# Patient Record
Sex: Male | Born: 1990 | Race: White | Hispanic: No | Marital: Single | Smoking: Current every day smoker
Health system: Southern US, Community
[De-identification: ages and names within clinical notes are randomized; demographics above are authoritative.]

## PROBLEM LIST (undated history)

## (undated) DIAGNOSIS — L0390 Cellulitis, unspecified: Secondary | ICD-10-CM

## (undated) DIAGNOSIS — F419 Anxiety disorder, unspecified: Secondary | ICD-10-CM

## (undated) HISTORY — PX: HERNIA REPAIR: SHX51

## (undated) HISTORY — PX: ADENOIDECTOMY: SUR15

---
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
## 2010-12-15 NOTE — Op Note (Signed)
[place]. [facility]  Patient:    JAVARIE, CRISP                        MRN: [id number] Proc. Date: 12/11/99 Attending:  Veverly Fells. Arletha Grippe, M.D. CC:         Veverly Fells. Arletha Grippe, M.D.             Harrietta Guardian, M.D., [facility]                           Operative Report  PREOPERATIVE DIAGNOSIS:  Obstructive sleep apnea and tonsil and adenoid hypertrophy.  POSTOPERATIVE DIAGNOSIS:  Obstructive sleep apnea and tonsil and adenoid hypertrophy.  PROCEDURE PERFORMED:  Tonsillectomy and adenoidectomy.  SURGEON:  Veverly Fells. Arletha Grippe, M.D.  ANESTHESIA:  General endotracheal.  INDICATIONS:  Patient is an otherwise healthy 20-year-old white male, who gives a long history of nasal airway obstruction, loud breathing at night and breath holding episodes as documented by mothers history.  Physical examination does show extremely enlarged tonsils bilaterally with adenoid hypertrophy in the nasopharynx.  Based on his history and physical examination, I have recommended proceeding with the above noted surgical procedure.  I have discussed extensively with the family members the risks and benefits of surgery, including risk of general anesthesia, infection, bleeding and the normal recovery period after this type of surgery.  I have entertained any questions, answered them appropriately and informed consent has been obtained and patient presents for the above noted procedure.  OPERATIVE FINDINGS:  Enlarged tonsils bilaterally and large adenoidal tissue in the nasopharynx.  DETAILS OF PROCEDURE:  Patient was brought in the operating room, placed in the supine position, general endotracheal anesthesia was administered via the anesthesiologist without complication.  Patient was administered 500 mg of Ancef IV x one and 6 mg of Decadron IV x one.  Head of the table was turned 90 degrees.  Patients face was draped in a standard fashion.  A Crowe-Davis mouth retractor  was inserted into the oral cavity.  This was used to retract the mouth open.  A curved Allis clamp used to grasp the right tonsil and retract it medially.  The Harmonic scalpel was then used to dissect the tonsil free from the tonsillar fossa.  Bleeding was controlled with a combination of Harmonic scalpel and suction cautery without difficulty.  The tonsil was then removed and sent to surgical pathology for permanent section analysis.  Next, attention was turned to the left tonsil.  An identical procedure was carried out on this side compared to the right side with identical results.  After this was done, any bleeding from the tonsillar fossae were controlled meticulously with suction cautery without difficulty.  A red rubber catheter was placed through the right naris and brought out through oral cavity.  This was used retract the soft palate.  Indirect naris examination and nasopharynx showed enlarged adenoidal tissue obstructing the carina bilaterally.  The adenoids were removed with a few sweeps of the adenoid curet and any bleeding in the area was controlled with suction cautery without difficulty.  The nose and mouth were then irrigated with copious amounts of irrigation fluid and suctioned dry.  There was no evidence of any active bleeding at the end of this procedure.  Red rubber was brought out through the nasal chamber under suction without difficulty.  An oral gastric tube was placed.  This was used to decompress the  stomach contents.  It was then removed without incident and a total of 3 cc of 0.5% Marcaine solution with 1:200,000 epinephrine were infiltrated into the anterior tonsillar pillars and tongue base bilaterally. Crowe-Davis mouth retractor was released and brought out through oral cavity without incident.  Fluids given during procedure were approximately 200 cc of Crystalloid.  Estimated blood loss was less than 30 cc.  Urine output was not measured.  There were no  drains, no packs.  Specimens sent were tonsils x 2 and adenoids.  The patient tolerated the procedure well without complications, was extubated in the operating room and transferred to recovery room in stable condition.  Sponge, needle and instrument counts were correct at the end of the procedure.  Total duration of procedure was approximately 1 hour.  Patient will be admitted for overnight recovery and once he has recovered well, he will be sent home on Dec 12, 1999.  He will be sent home on amoxicillin elixir 250 mg p.o. t.i.d. for 10 days and Tylenol with codeine elixir 250 cc with two refills 5-10 cc p.o. q.4h. p.r.n. pain.  He is to have light activity and a post tonsillectomy diet for two weeks.  Patients family was given oral and written instructions.  They are to call for any problems of bleeding, fever, vomiting, pain or extra medications or any other questions.  He will follow up for postoperative check on Tuesday, May 29th at 3:50 p.m. DD:  12/11/99 TD:  12/11/99 Job: [id number]

## 2010-12-15 NOTE — Consult Note (Signed)
[place]. [facility]  Patient:    Devin Graham, Devin Graham Visit Number: [id number] MRN: [id number]          Service Type: EMS Location: [facility] Attending Physician:  Armanda Heritage Dictated by:   Cammy Copa, M.D. Proc. Date: 11/11/02 Admit Date:  11/10/2001 Discharge Date: 11/10/2001                            Consultation Report  CHIEF COMPLAINT:  Right wrist pain.  HISTORY OF PRESENT ILLNESS:  The patient is an 20 year old right-hand dominant patient who fell when shooting basketball today and landed on his right wrist. He reports right wrist pain.  PAST MEDICAL/SURGICAL HISTORY:  Unremarkable.  ALLERGIES:  No known drug allergies.  MEDICATIONS:  None.  PHYSICAL EXAMINATION:  GENERAL:  The patient is in mild distress.  EXTREMITIES:  He has some tenderness and very mild swelling around the right distal radius. Grip detail _____________ extended biceps, triceps, deltoids. Strength was ___/5. The elbow and shoulder range of motion is nontender and intact. Radial pulse 2+/4; sensation intact in a median, radial, and ulnar distribution. Plain x-rays show a dorsally angulated wrist fracture about 2 cm proximal to the _____ plane which is transverse and unicortical. There is about 20 degrees of dorsal ambulation.  IMPRESSION:  Dorsally angulated wrist fracture in an 46 year old child. Fracture is in the plane of his deformity.  PLAN:  Hematoma block was administered, and a general reduction was performed, and a cast was applied with 3-point molding. Post-reduction radiographs demonstrate mild improvement in the position of the fracture. I am going to see the patient back in 7 days for recheck. The patient was instructed to elevate his arm and to move his fingers as much as possible. Tylenol #3 elixir is given. Dictated by:   Cammy Copa, M.D. Attending Physician:  Armanda Heritage DD:  11/10/01 TD:  11/11/01 Job: [id number]

## 2014-01-14 ENCOUNTER — Encounter (HOSPITAL_COMMUNITY): Payer: Self-pay | Admitting: Emergency Medicine

## 2014-01-14 ENCOUNTER — Emergency Department (HOSPITAL_COMMUNITY)
Admission: EM | Admit: 2014-01-14 | Discharge: 2014-01-14 | Disposition: A | Discharge status: Home / Self Care | Payer: Self-pay | Attending: Emergency Medicine | Admitting: Emergency Medicine

## 2014-01-14 DIAGNOSIS — H938X9 Other specified disorders of ear, unspecified ear: Secondary | ICD-10-CM | POA: Insufficient documentation

## 2014-01-14 DIAGNOSIS — H938X2 Other specified disorders of left ear: Secondary | ICD-10-CM

## 2014-01-14 DIAGNOSIS — R599 Enlarged lymph nodes, unspecified: Secondary | ICD-10-CM | POA: Insufficient documentation

## 2014-01-14 DIAGNOSIS — Z87891 Personal history of nicotine dependence: Secondary | ICD-10-CM | POA: Insufficient documentation

## 2014-01-14 DIAGNOSIS — Q170 Accessory auricle: Secondary | ICD-10-CM | POA: Insufficient documentation

## 2014-01-14 DIAGNOSIS — R590 Localized enlarged lymph nodes: Secondary | ICD-10-CM

## 2014-01-14 NOTE — ED Notes (Signed)
Patient refused wheelchair.  Walked patient out. 

## 2014-01-14 NOTE — ED Notes (Signed)
Patient states he started getting possible abscesses in L earlobe approximately 1 year ago.  Patient states that it comes and goes.   Patient states he has "been able to pop previously, but I didn't get a lot of stuff out of it".

## 2014-01-14 NOTE — ED Provider Notes (Signed)
CSN: [id number]     Arrival date & time 01/14/14  0820 History   First MD Initiated Contact with Patient 01/14/14 [phone]     Chief Complaint  Patient presents with  . mass in ear lobe      (Consider location/radiation/quality/duration/timing/severity/associated sxs/prior Treatment) HPI Pt is a 23yo male presenting to ED with a mass on his left earlobe.  Pt states he started to get possible abscesses on his left earlobe about 1 year ago.  States the mass comes and goes. He has "been able to pop previously, but didn't get a lot of stuff out of it." Pt states the mass is not usually painful but has been increasingly more irritated with head rotation and when he tries to "pop" it. Denies fever, n/v/d. Denies cough, congestion, sore throat, middle ear pain, hx of cancer, or unexplained weight loss.   History reviewed. No pertinent past medical history. Past Surgical History  Procedure Laterality Date  . Hernia repair    . Adenoidectomy     No family history on file. History  Substance Use Topics  . Smoking status: Former Games developermoker  . Smokeless tobacco: Not on file  . Alcohol Use: Yes    Review of Systems  Constitutional: Negative for fever and chills.  Gastrointestinal: Negative for nausea and vomiting.  Skin: Negative for color change and wound.  All other systems reviewed and are negative.     Allergies  Review of patient's allergies indicates no known allergies.  Home Medications   Prior to Admission medications   Not on File   BP 125/82  Pulse 74  Temp(Src) 97.7 F (36.5 C) (Oral)  Resp 18  Ht 5\' 11"  (1.803 m)  Wt 190 lb (86.183 kg)  BMI 26.51 kg/m2  SpO2 100% Physical Exam  Nursing note and vitals reviewed. Constitutional: He is oriented to person, place, and time. He appears well-developed and well-nourished.  HENT:  Head: Normocephalic and atraumatic.  Eyes: EOM are normal.  Neck: Normal range of motion.  Cardiovascular: Normal rate.   Pulmonary/Chest: Effort  normal.  Musculoskeletal: Normal range of motion.  Neurological: He is alert and oriented to person, place, and time.  Skin: Skin is warm and dry.  Pea-sized hard mobile mass with mild tenderness in area of preauricular lymph node. No erythema or ecchymosis. No discharge or red streaking.   Psychiatric: He has a normal mood and affect. His behavior is normal.    ED Course  Procedures (including critical care time) Labs Review Labs Reviewed - No data to display  Imaging Review No results found.   EKG Interpretation None      MDM   Final diagnoses:  Mass of left ear  Lymphadenopathy, preauricular    Pt presenting to ED with c/o tender mass to left earlobe x1 year.  On exam, mass is in area of preauricular lymph node.  Does not appear cellulitic or an abscess. Lymphadenopathy vs lipoma. Advised pt to only apply cool compresses and take acetaminophen and/or ibuprofen for pain. Discouraged pt from attempting to "pop" the mass at his may cause it to enlarge even more. [facility], and general surgery so it can be monitored and possibly removed if it continues to become enlarged and cause pt irritation.      Junius FinnerErin O'Malley, PA-C 01/14/14 [phone]

## 2014-01-14 NOTE — ED Provider Notes (Signed)
Medical screening examination/treatment/procedure(s) were performed by non-physician practitioner and as supervising physician I was immediately available for consultation/collaboration.   EKG Interpretation None        Kristen N Ward, DO 01/14/14 1243 

## 2014-01-14 NOTE — Discharge Instructions (Signed)
You may try cool compresses on left ear and take acetaminophen and ibuprofen as needed for pain. See below for more instruction.   Swollen Lymph Nodes The lymphatic system filters fluid from around cells. It is like a system of blood vessels. These channels carry lymph instead of blood. The lymphatic system is an important part of the immune (disease fighting) system. When people talk about "swollen glands in the neck," they are usually talking about swollen lymph nodes. The lymph nodes are like the little traps for infection. You and your caregiver may be able to feel lymph nodes, especially swollen nodes, in these common areas: the groin (inguinal area), armpits (axilla), and above the clavicle (supraclavicular). You may also feel them in the neck (cervical) and the back of the head just above the hairline (occipital). Swollen glands occur when there is any condition in which the body responds with an allergic type of reaction. For instance, the glands in the neck can become swollen from insect bites or any type of minor infection on the head. These are very noticeable in children with only minor problems. Lymph nodes may also become swollen when there is a tumor or problem with the lymphatic system, such as Hodgkin's disease. TREATMENT   Most swollen glands do not require treatment. They can be observed (watched) for a short period of time, if your caregiver feels it is necessary. Most of the time, observation is not necessary.  Antibiotics (medicines that kill germs) may be prescribed by your caregiver. Your caregiver may prescribe these if he or she feels the swollen glands are due to a bacterial (germ) infection. Antibiotics are not used if the swollen glands are caused by a virus. HOME CARE INSTRUCTIONS   Take medications as directed by your caregiver. Only take over-the-counter or prescription medicines for pain, discomfort, or fever as directed by your caregiver. SEEK MEDICAL CARE IF:   If  you begin to run a temperature greater than 102 F (38.9 C), or as your caregiver suggests. MAKE SURE YOU:   Understand these instructions.  Will watch your condition.  Will get help right away if you are not doing well or get worse. Document Released: 07/06/2002 Document Revised: 10/08/2011 Document Reviewed: 07/16/2005 [facility] Patient Information 2015 [place], [place]. This information is not intended to replace advice given to you by your health care provider. Make sure you discuss any questions you have with your health care provider.

## 2014-01-21 ENCOUNTER — Encounter (HOSPITAL_COMMUNITY): Payer: Self-pay | Admitting: Emergency Medicine

## 2014-01-21 ENCOUNTER — Emergency Department (HOSPITAL_COMMUNITY)
Admission: EM | Admit: 2014-01-21 | Discharge: 2014-01-22 | Disposition: A | Discharge status: Home / Self Care | Payer: Self-pay | Attending: Emergency Medicine | Admitting: Emergency Medicine

## 2014-01-21 DIAGNOSIS — G43009 Migraine without aura, not intractable, without status migrainosus: Secondary | ICD-10-CM

## 2014-01-21 DIAGNOSIS — G43909 Migraine, unspecified, not intractable, without status migrainosus: Secondary | ICD-10-CM | POA: Insufficient documentation

## 2014-01-21 DIAGNOSIS — R0789 Other chest pain: Secondary | ICD-10-CM | POA: Insufficient documentation

## 2014-01-21 DIAGNOSIS — L723 Sebaceous cyst: Secondary | ICD-10-CM | POA: Insufficient documentation

## 2014-01-21 DIAGNOSIS — Z792 Long term (current) use of antibiotics: Secondary | ICD-10-CM | POA: Insufficient documentation

## 2014-01-21 DIAGNOSIS — Z87891 Personal history of nicotine dependence: Secondary | ICD-10-CM | POA: Insufficient documentation

## 2014-01-21 LAB — CBC
HEMATOCRIT: 43.4 % (ref 39.0–52.0)
HEMOGLOBIN: 15.4 g/dL (ref 13.0–17.0)
MCH: 32.1 pg (ref 26.0–34.0)
MCHC: 35.5 g/dL (ref 30.0–36.0)
MCV: 90.4 fL (ref 78.0–100.0)
Platelets: 196 10*3/uL (ref 150–400)
RBC: 4.8 MIL/uL (ref 4.22–5.81)
RDW: 12 % (ref 11.5–15.5)
WBC: 7.7 10*3/uL (ref 4.0–10.5)

## 2014-01-21 LAB — I-STAT TROPONIN, ED: Troponin i, poc: 0 ng/mL (ref 0.00–0.08)

## 2014-01-21 LAB — BASIC METABOLIC PANEL
BUN: 10 mg/dL (ref 6–23)
CHLORIDE: 101 meq/L (ref 96–112)
CO2: 28 mEq/L (ref 19–32)
Calcium: 10 mg/dL (ref 8.4–10.5)
Creatinine, Ser: 0.93 mg/dL (ref 0.50–1.35)
GFR calc Af Amer: >90 mL/min (ref >90)
GFR calc non Af Amer: >90 mL/min (ref >90)
Glucose, Bld: 95 mg/dL (ref 70–99)
Potassium: 4.9 mEq/L (ref 3.7–5.3)
SODIUM: 142 meq/L (ref 137–147)

## 2014-01-21 LAB — LIPASE, BLOOD: LIPASE: 28 U/L (ref 11–59)

## 2014-01-21 NOTE — ED Provider Notes (Signed)
MSE was initiated and I personally evaluated the patient and placed orders (if any) at  10:58 PM on January 21, 2014.  Devin Graham is a 23 y.o. male who presents to the Emergency Department complaining of large abscess to back of L ear with associated migraine that presented 1 month ago. States the abscess initially became swollen then noticed the infection. He was seen last week, 01/14/2014, and informed he had "an inflamed lymph node"; states he put a needle in the abscess, 01/16/2014, and had blood and puss drainage. Was advised to come to the ED by PCP. Pt rates his migraine 6/10 and reports feeling nauseated and abd pain. He reports having low grade fevers, less than 100 for the past few days and chest tightness that began today; states the chest tightness was sharp and sudden. He states the pain began while resting after work. He also c/o neck pain and neck stiffness that began "a few days ago". Smoke 1ppd of cigarettes. Denies CP, SOB, diarrhea, emesis, hematuria, hematochezia, tarry stools, internal ear pain, trouble hearing, chills or piercing to L ear.  I personally performed the services described in this documentation, which was scribed in my presence. The recorded information has been reviewed and is accurate.  Patient presenting to the ED with multiple complaints-neck pain, neck stiffness, low-grade fever, chest tightness, nausea and lower, pain that started approximately 2 days ago. Patient reported that he was seen and assessed by his primary care provider who recommended patient to come to the emergency department to be further evaluated. Patient was laid seen and assessed in ED setting on 01/14/2014 regarding his ongoing enlarged lymph node he was discharged home with no medications and close followup. Do to multiple complaints and beginnings of chest and discomfort patient to be moved to main ED for further evaluation. Labs and imaging ordered.  The patient appears stable so that the  remainder of the MSE may be completed by another provider.  Devin MuttonMarissa Rileyann Florance, PA-C 01/22/14 [phone]

## 2014-01-21 NOTE — ED Notes (Signed)
Patient presents with a large "abcess" to the back of left ear.  Was seen for the same last week and was told it was a swollen lymphnode.  Stuck a needle in the area and got some "pus" out.

## 2014-01-21 NOTE — ED Notes (Signed)
Per request of M.Sciacca PA-C acuity changed to 3. Pt reported to PA-C he has had stiff neck ,CP .

## 2014-01-22 ENCOUNTER — Emergency Department (HOSPITAL_COMMUNITY): Payer: Self-pay

## 2014-01-22 LAB — URINALYSIS, ROUTINE W REFLEX MICROSCOPIC
Glucose, UA: NEGATIVE mg/dL
Hgb urine dipstick: NEGATIVE
KETONES UR: 15 mg/dL — AB
LEUKOCYTES UA: NEGATIVE
Nitrite: NEGATIVE
PH: 6 (ref 5.0–8.0)
Protein, ur: NEGATIVE mg/dL
Specific Gravity, Urine: 1.037 — ABNORMAL HIGH (ref 1.005–1.030)
Urobilinogen, UA: 1 mg/dL (ref 0.0–1.0)

## 2014-01-22 MED ORDER — BUTALBITAL-APAP-CAFFEINE 50-325-40 MG PO TABS: 1.0000 | ORAL_TABLET | Freq: Four times a day (QID) | ORAL | Status: DC | PRN

## 2014-01-22 MED ORDER — BUTALBITAL-APAP-CAFFEINE 50-325-40 MG PO TABS: 1.0000 | ORAL_TABLET | Freq: Four times a day (QID) | ORAL | Status: AC | PRN

## 2014-01-22 NOTE — ED Notes (Signed)
Patient transported to X-ray 

## 2014-01-22 NOTE — Discharge Instructions (Signed)
Chest Pain (Nonspecific) It is often hard to give a diagnosis for the cause of chest pain. There is always a chance that your pain could be related to something serious, such as a heart attack or a blood clot in the lungs. You need to follow up with your doctor. HOME CARE  If antibiotic medicine was given, take it as directed by your doctor. Finish the medicine even if you start to feel better.  For the next few days, avoid activities that bring on chest pain. Continue physical activities as told by your doctor.  Do not use any tobacco products. This includes cigarettes, chewing tobacco, and e-cigarettes.  Avoid drinking alcohol.  Only take medicine as told by your doctor.  Follow your doctor's suggestions for more testing if your chest pain does not go away.  Keep all doctor visits you made. GET HELP IF:  Your chest pain does not go away, even after treatment.  You have a rash with blisters on your chest.  You have a fever. GET HELP RIGHT AWAY IF:   You have more pain or pain that spreads to your arm, neck, jaw, back, or belly (abdomen).  You have shortness of breath.  You cough more than usual or cough up blood.  You have very bad back or belly pain.  You feel sick to your stomach (nauseous) or throw up (vomit).  You have very bad weakness.  You pass out (faint).  You have chills. This is an emergency. Do not wait to see if the problems will go away. Call your local emergency services (911 in [place] Do not drive yourself to the hospital. MAKE SURE YOU:   Understand these instructions.  Will watch your condition.  Will get help right away if you are not doing well or get worse. Document Released: 01/02/2008 Document Revised: 07/21/2013 Document Reviewed: 01/02/2008 [facility] Patient Information 2015 [place]ExitCare, [place]. This information is not intended to replace advice given to you by your health care provider. Make sure you discuss any questions you have with your  health care provider.  Epidermal Cyst An epidermal cyst is sometimes called a sebaceous cyst, epidermal inclusion cyst, or infundibular cyst. These cysts usually contain a substance that looks "pasty" or "cheesy" and may have a bad smell. This substance is a protein called keratin. Epidermal cysts are usually found on the face, neck, or trunk. They may also occur in the vaginal area or other parts of the genitalia of both men and women. Epidermal cysts are usually small, painless, slow-growing bumps or lumps that move freely under the skin. It is important not to try to pop them. This may cause an infection and lead to tenderness and swelling. CAUSES  Epidermal cysts may be caused by a deep penetrating injury to the skin or a plugged hair follicle, often associated with acne. SYMPTOMS  Epidermal cysts can become inflamed and cause:  Redness.  Tenderness.  Increased temperature of the skin over the bumps or lumps.  Grayish-white, bad smelling material that drains from the bump or lump. DIAGNOSIS  Epidermal cysts are easily diagnosed by your caregiver during an exam. Rarely, a tissue sample (biopsy) may be taken to rule out other conditions that may resemble epidermal cysts. TREATMENT   Epidermal cysts often get better and disappear on their own. They are rarely ever cancerous.  If a cyst becomes infected, it may become inflamed and tender. This may require opening and draining the cyst. Treatment with antibiotics may be necessary. When the  infection is gone, the cyst may be removed with minor surgery.  Small, inflamed cysts can often be treated with antibiotics or by injecting steroid medicines.  Sometimes, epidermal cysts become large and bothersome. If this happens, surgical removal in your caregiver's office may be necessary. HOME CARE INSTRUCTIONS  Only take over-the-counter or prescription medicines as directed by your caregiver.  Take your antibiotics as directed. Finish them even  if you start to feel better. SEEK MEDICAL CARE IF:   Your cyst becomes tender, red, or swollen.  Your condition is not improving or is getting worse.  You have any other questions or concerns. MAKE SURE YOU:  Understand these instructions.  Will watch your condition.  Will get help right away if you are not doing well or get worse. Document Released: 06/16/2004 Document Revised: 10/08/2011 Document Reviewed: 01/22/2011 [facility] Patient Information 2015 [place]ExitCare, [place]LL[place]. This information is not intended to replace advice given to you by your health care provider. Make sure you discuss any questions you have with your health care provider.  Migraine Headache A migraine headache is an intense, throbbing pain on one or both sides of your head. A migraine can last for 30 minutes to several hours. CAUSES  The exact cause of a migraine headache is not always known. However, a migraine may be caused when nerves in the brain become irritated and release chemicals that cause inflammation. This causes pain. Certain things may also trigger migraines, such as:  Alcohol.  Smoking.  Stress.  Menstruation.  Aged cheeses.  Foods or drinks that contain nitrates, glutamate, aspartame, or tyramine.  Lack of sleep.  Chocolate.  Caffeine.  Hunger.  Physical exertion.  Fatigue.  Medicines used to treat chest pain (nitroglycerine), birth control pills, estrogen, and some blood pressure medicines. SIGNS AND SYMPTOMS  Pain on one or both sides of your head.  Pulsating or throbbing pain.  Severe pain that prevents daily activities.  Pain that is aggravated by any physical activity.  Nausea, vomiting, or both.  Dizziness.  Pain with exposure to bright lights, loud noises, or activity.  General sensitivity to bright lights, loud noises, or smells. Before you get a migraine, you may get warning signs that a migraine is coming (aura). An aura may include:  Seeing flashing  lights.  Seeing bright spots, halos, or zig-zag lines.  Having tunnel vision or blurred vision.  Having feelings of numbness or tingling.  Having trouble talking.  Having muscle weakness. DIAGNOSIS  A migraine headache is often diagnosed based on:  Symptoms.  Physical exam.  A CT scan or MRI of your head. These imaging tests cannot diagnose migraines, but they can help rule out other causes of headaches. TREATMENT Medicines may be given for pain and nausea. Medicines can also be given to help prevent recurrent migraines.  HOME CARE INSTRUCTIONS  Only take over-the-counter or prescription medicines for pain or discomfort as directed by your health care provider. The use of long-term narcotics is not recommended.  Lie down in a dark, quiet room when you have a migraine.  Keep a journal to find out what may trigger your migraine headaches. For example, write down:  What you eat and drink.  How much sleep you get.  Any change to your diet or medicines.  Limit alcohol consumption.  Quit smoking if you smoke.  Get 7-9 hours of sleep, or as recommended by your health care provider.  Limit stress.  Keep lights dim if bright lights bother you and make your  migraines worse. SEEK IMMEDIATE MEDICAL CARE IF:   Your migraine becomes severe.  You have a fever.  You have a stiff neck.  You have vision loss.  You have muscular weakness or loss of muscle control.  You start losing your balance or have trouble walking.  You feel faint or pass out.  You have severe symptoms that are different from your first symptoms. MAKE SURE YOU:   Understand these instructions.  Will watch your condition.  Will get help right away if you are not doing well or get worse. Document Released: 07/16/2005 Document Revised: 05/06/2013 Document Reviewed: 03/23/2013 [facility] Patient Information 2015 [place], [place]. This information is not intended to replace advice given to you by your  health care provider. Make sure you discuss any questions you have with your health care provider.

## 2014-01-22 NOTE — ED Notes (Addendum)
Pt reports bump on back of left ear lobe for 2 years. States bump got larger in past month. Came to the ER on 01/14/14 and was told it was a swollen lymph node. Pt states he aspirated bump with a needle by himself on 01/16/14 and pus drained from site. Pt has an abscess on back of left ear lobe approximately a size of a dime. Pt denies chest pain and abdominal pain at this time.

## 2014-01-22 NOTE — ED Provider Notes (Signed)
Medical screening examination/treatment/procedure(s) were performed by non-physician practitioner and as supervising physician I was immediately available for consultation/collaboration.   EKG Interpretation   Date/Time:  Friday January 22 2014 00:11:34 EDT Ventricular Rate:  58 PR Interval:  172 QRS Duration: 90 QT Interval:  398 QTC Calculation: 390 R Axis:   44 Text Interpretation:  Sinus bradycardia Cannot rule out Anterior infarct ,  age undetermined Abnormal ECG No old tracing to compare Confirmed by OTTER   MD, OLGA ([id number]) on 01/22/2014 3:04:39 AM        Gwyneth SproutWhitney Plunkett, MD 01/22/14 1408

## 2014-01-22 NOTE — ED Notes (Signed)
MD at bedside. 

## 2014-01-24 LAB — WOUND CULTURE

## 2014-01-25 ENCOUNTER — Emergency Department (HOSPITAL_COMMUNITY)
Admission: EM | Admit: 2014-01-25 | Discharge: 2014-01-25 | Disposition: A | Discharge status: Home / Self Care | Payer: Self-pay | Attending: Emergency Medicine | Admitting: Emergency Medicine

## 2014-01-25 ENCOUNTER — Emergency Department (HOSPITAL_COMMUNITY): Payer: Self-pay

## 2014-01-25 ENCOUNTER — Encounter (HOSPITAL_COMMUNITY): Payer: Self-pay | Admitting: Emergency Medicine

## 2014-01-25 ENCOUNTER — Other Ambulatory Visit: Payer: Self-pay

## 2014-01-25 DIAGNOSIS — R51 Headache: Secondary | ICD-10-CM | POA: Insufficient documentation

## 2014-01-25 DIAGNOSIS — R42 Dizziness and giddiness: Secondary | ICD-10-CM | POA: Insufficient documentation

## 2014-01-25 DIAGNOSIS — R0789 Other chest pain: Secondary | ICD-10-CM | POA: Insufficient documentation

## 2014-01-25 DIAGNOSIS — Z87891 Personal history of nicotine dependence: Secondary | ICD-10-CM | POA: Insufficient documentation

## 2014-01-25 DIAGNOSIS — R519 Headache, unspecified: Secondary | ICD-10-CM

## 2014-01-25 DIAGNOSIS — R209 Unspecified disturbances of skin sensation: Secondary | ICD-10-CM | POA: Insufficient documentation

## 2014-01-25 DIAGNOSIS — R112 Nausea with vomiting, unspecified: Secondary | ICD-10-CM | POA: Insufficient documentation

## 2014-01-25 LAB — I-STAT CHEM 8, ED
BUN: 10 mg/dL (ref 6–23)
CREATININE: 1 mg/dL (ref 0.50–1.35)
Calcium, Ion: 1.22 mmol/L (ref 1.12–1.23)
Chloride: 100 mEq/L (ref 96–112)
Glucose, Bld: 91 mg/dL (ref 70–99)
HCT: 47 % (ref 39.0–52.0)
Hemoglobin: 16 g/dL (ref 13.0–17.0)
Potassium: 4.3 mEq/L (ref 3.7–5.3)
Sodium: 142 mEq/L (ref 137–147)
TCO2: 26 mmol/L (ref 0–100)

## 2014-01-25 LAB — I-STAT TROPONIN, ED: Troponin i, poc: 0 ng/mL (ref 0.00–0.08)

## 2014-01-25 MED ORDER — ONDANSETRON 4 MG PO TBDP: ORAL_TABLET | ORAL | Status: DC

## 2014-01-25 MED ORDER — DOXYCYCLINE HYCLATE 100 MG PO CAPS: 100.0000 mg | ORAL_CAPSULE | Freq: Two times a day (BID) | ORAL | Status: DC

## 2014-01-25 MED ORDER — SODIUM CHLORIDE 0.9 % IV BOLUS (SEPSIS)
1000.0000 mL | INTRAVENOUS | Status: AC
Start: 2014-01-25 — End: 2014-01-25
  Administered 2014-01-25: 1000 mL via INTRAVENOUS

## 2014-01-25 MED ORDER — ACETAMINOPHEN 325 MG PO TABS
650.0000 mg | ORAL_TABLET | Freq: Once | ORAL | Status: AC
  Administered 2014-01-25: 650 mg via ORAL
  Filled 2014-01-25: qty 2

## 2014-01-25 NOTE — ED Notes (Signed)
The main system is down to obtain EKG's and have to use the old machine to obtain EKG but patient is in CT.

## 2014-01-25 NOTE — ED Provider Notes (Signed)
CSN: [id number]     Arrival date & time 01/25/14  1121 History   First MD Initiated Contact with Patient 01/25/14 1459     Chief Complaint  Patient presents with  . Headache  . Emesis     (Consider location/radiation/quality/duration/timing/severity/associated sxs/prior Treatment) Patient is a 23 y.o. male presenting with headaches and vomiting. The history is provided by the patient.  Headache Pain location:  L temporal Radiates to:  Does not radiate Severity currently:  2/10 Severity at highest:  2/10 Onset quality:  Gradual Duration:  5 hours Timing:  Constant Progression:  Partially resolved Chronicity:  New Similar to prior headaches: yes   Context comment:  After emesis Relieved by:  Nothing Worsened by:  Nothing tried Ineffective treatments:  None tried Associated symptoms: nausea and vomiting   Associated symptoms: no abdominal pain, no cough, no diarrhea, no pain, no fever, no neck pain and no numbness   Emesis Associated symptoms: headaches   Associated symptoms: no abdominal pain and no diarrhea     History reviewed. No pertinent past medical history. Past Surgical History  Procedure Laterality Date  . Hernia repair    . Adenoidectomy     History reviewed. No pertinent family history. History  Substance Use Topics  . Smoking status: Former Games developer  . Smokeless tobacco: Not on file  . Alcohol Use: Yes    Review of Systems  Constitutional: Negative for fever.  HENT: Negative for drooling and rhinorrhea.   Eyes: Negative for pain.  Respiratory: Positive for chest tightness. Negative for cough and shortness of breath.   Cardiovascular: Negative for chest pain and leg swelling.  Gastrointestinal: Positive for nausea and vomiting. Negative for abdominal pain and diarrhea.  Genitourinary: Negative for dysuria and hematuria.  Musculoskeletal: Negative for gait problem and neck pain.  Skin: Negative for color change.  Neurological: Positive for headaches.  Negative for numbness.       Paresthesias  Hematological: Negative for adenopathy.  Psychiatric/Behavioral: Negative for behavioral problems.  All other systems reviewed and are negative.     Allergies  Review of patient's allergies indicates no known allergies.  Home Medications   Prior to Admission medications   Medication Sig Start Date End Date Taking? Authorizing Provider  butalbital-acetaminophen-caffeine (FIORICET) 50-325-40 MG per tablet Take 1 tablet by mouth every 6 (six) hours as needed for headache. 01/22/14 01/22/15  Olivia Mackie, MD   BP 129/77  Pulse 64  Temp(Src) 97.9 F (36.6 C) (Oral)  Resp 18  SpO2 97% Physical Exam  Nursing note and vitals reviewed. Constitutional: He is oriented to person, place, and time. He appears well-developed and well-nourished.  HENT:  Head: Normocephalic and atraumatic.  Right Ear: External ear normal.  Left Ear: External ear normal.  Nose: Nose normal.  Mouth/Throat: Oropharynx is clear and moist. No oropharyngeal exudate.  Small sebaceous cyst behind the left earlobe which is soft. Is not indurated or erythematous.   Normal appearing tympanic membranes bilaterally.  Eyes: Conjunctivae and EOM are normal. Pupils are equal, round, and reactive to light.  Neck: Normal range of motion. Neck supple.  Cardiovascular: Normal rate, regular rhythm, normal heart sounds and intact distal pulses.  Exam reveals no gallop and no friction rub.   No murmur heard. Pulmonary/Chest: Effort normal and breath sounds normal. No respiratory distress. He has no wheezes.  Abdominal: Soft. Bowel sounds are normal. He exhibits no distension. There is no tenderness. There is no rebound and no guarding.  Musculoskeletal: Normal range  of motion. He exhibits no edema and no tenderness.  Neurological: He is alert and oriented to person, place, and time.  alert, oriented x3 speech: normal in context and clarity memory: intact grossly cranial nerves II-XII:  intact motor strength: full proximally and distally no involuntary movements or tremors sensation: intact to light touch diffusely  cerebellar: finger-to-nose and heel-to-shin intact gait: normal forwards and backwards, normal tandem gait   Skin: Skin is warm and dry.  Psychiatric: He has a normal mood and affect. His behavior is normal.    ED Course  Procedures (including critical care time) Labs Review Labs Reviewed  I-STAT CHEM 8, ED  Rosezena SensorI-STAT TROPOININ, ED    Imaging Review Ct Head Wo Contrast  01/25/2014   CLINICAL DATA:  Headache.  Nausea and vomiting.  Dizziness.  EXAM: CT HEAD WITHOUT CONTRAST  TECHNIQUE: Contiguous axial images were obtained from the base of the skull through the vertex without intravenous contrast.  COMPARISON:  None.  FINDINGS: Ventricular system normal in size and appearance for age. No mass lesion. No midline shift. No acute hemorrhage or hematoma. No extra-axial fluid collections. No evidence of acute infarction. No focal brain parenchymal abnormalities.  No focal osseous abnormalities involving the skull. Visualized paranasal sinuses, bilateral mastoid air cells, and bilateral middle ear cavities well-aerated.  IMPRESSION: Normal examination.   Electronically Signed   By: Hulan Saashomas  Lawrence M.D.   On: 01/25/2014 15:43     EKG Interpretation None      Date: 01/25/2014  Rate: 56  Rhythm: sinus bradycardia  QRS Axis: normal  Intervals: normal  ST/T Wave abnormalities: normal  Conduction Disutrbances:none  Narrative Interpretation: No ST or T wave changes consistent with ischemia.    Old EKG Reviewed: unchanged   MDM   Final diagnoses:  Headache, unspecified headache type    3:18 PM 23 y.o. male who presents with an episode of vomiting and paresthesias which began approximately 10:30 AM this morning. He notes that he ate a biscuit and approximately 15 minutes later became nauseous and had one episode of emesis. After this he developed a gradual  onset 2/10 left temporal headache and some lightheadedness. He also had some paresthesias of his face, hands, and left foot which resolved after 30 minutes. He currently has a 2/10 headache but denies any other symptoms. He does note that he had some mild chest tightness but this has resolved as well. He was seen recently for appears to be a sebaceous cyst behind his left earlobe which was aspirated. He does not appear to be infected or indurated. His vital signs are unremarkable and has a normal neurologic exam. Will get screening labs and imaging.  4:49 PM: I interpreted/reviewed the labs and/or imaging which were non-contributory.  Likely viral syndrome vs food ingestion as cause of his vomiting. HA now almost gone. Pt continues to appear well. He notes tick exposure several weeks ago, will tx empirically w/ doxy.  I have discussed the diagnosis/risks/treatment options with the patient and family and believe the pt to be eligible for discharge home to follow-up with pcp as needed. We also discussed returning to the ED immediately if new or worsening sx occur. We discussed the sx which are most concerning (e.g., worsening HA, rash, fever) that necessitate immediate return. Medications administered to the patient during their visit and any new prescriptions provided to the patient are listed below.  Medications given during this visit Medications  sodium chloride 0.9 % bolus 1,000 mL (1,000 mLs Intravenous New  Bag/Given 01/25/14 1559)  acetaminophen (TYLENOL) tablet 650 mg (650 mg Oral Given 01/25/14 1559)    New Prescriptions   DOXYCYCLINE (VIBRAMYCIN) 100 MG CAPSULE    Take 1 capsule (100 mg total) by mouth 2 (two) times daily. One po bid x 7 days   ONDANSETRON (ZOFRAN ODT) 4 MG DISINTEGRATING TABLET    4mg  ODT q4 hours prn nausea/vomit      Junius ArgyleForrest S Harrison, MD 01/25/14 1652

## 2014-01-25 NOTE — ED Notes (Signed)
Pt vomited at work.  Had numbness tingling with vomiting.  Immediately post, pt numbness went away.  Pt does have slight headache.  Pt has been nauseated.  Weakness.  No fever.

## 2014-01-25 NOTE — ED Notes (Signed)
Per EMS, pt seen Thursday for headache.  Pt having nausea/vomiting.  Dizziness while standing.   No Stroke symptoms.  Vitals:  130/80, hr 64, resp 16.  cbg 111.

## 2014-01-25 NOTE — Progress Notes (Signed)
[facility] provided pt with a list of primary care resources and a [facility] [employer] Card application to help patient establish primary care.  °

## 2014-02-08 NOTE — ED Provider Notes (Signed)
CSN: [id number]     Arrival date & time 01/21/14  1915 History   First MD Initiated Contact with Patient 01/21/14 2234     Chief Complaint  Patient presents with  . Abcess      (Consider location/radiation/quality/duration/timing/severity/associated sxs/prior Treatment) HPI 23 yo male presents to the ER from home with complaint of swelling, drainage to bump to left posterior ear.  Bump has been present for about 2 years.  He was seen for same about a week ago at that time c/o enlarged lymph node as well.  He was recommended f/u with ENT or surgery, but has not yet made appointments.  He has been squeezing the area, and "popped it with a pin" with some drainage.  Pt also with complaint of intermittent chest tightness lasting seconds over the last several hours.  He has had subjective fevers and chills.  He reports pain to left neck and "tension".  He is complaining of left sided headache that has been ongoing x 2days.  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  Headache at this time has improved.  No chest pain at this time.  No change in mental status.  No tick bites.  No rashes.   History reviewed. No pertinent past medical history. Past Surgical History  Procedure Laterality Date  . Hernia repair    . Adenoidectomy     History reviewed. No pertinent family history. History  Substance Use Topics  . Smoking status: Former Games developermoker  . Smokeless tobacco: Not on file  . Alcohol Use: Yes    Review of Systems  All other systems reviewed and are negative.     Allergies  Review of patient's allergies indicates no known allergies.  Home Medications   Prior to Admission medications   Medication Sig Start Date End Date Taking? Authorizing Provider  butalbital-acetaminophen-caffeine (FIORICET) 50-325-40 MG per tablet Take 1 tablet by mouth every 6 (six) hours as needed for headache. 01/22/14 01/22/15  Olivia Mackielga M Deonna Krummel, MD  doxycycline (VIBRAMYCIN) 100  MG capsule Take 1 capsule (100 mg total) by mouth 2 (two) times daily. One po bid x 7 days 01/25/14   Junius ArgyleForrest S Harrison, MD  ondansetron (ZOFRAN ODT) 4 MG disintegrating tablet 4mg  ODT q4 hours prn nausea/vomit 01/25/14   Junius ArgyleForrest S Harrison, MD   BP 114/68  Pulse 72  Temp(Src) 98.9 F (37.2 C) (Oral)  Resp 21  Ht 5\' 11"  (1.803 m)  Wt 190 lb (86.183 kg)  BMI 26.51 kg/m2  SpO2 97% Physical Exam  Nursing note and vitals reviewed. Constitutional: He is oriented to person, place, and time. He appears well-developed and well-nourished.  HENT:  Head: Normocephalic and atraumatic.  Right Ear: External ear normal.  Left Ear: External ear normal.  Nose: Nose normal.  Mouth/Throat: Oropharynx is clear and moist.  Cyst noted to left pinna  Eyes: Conjunctivae and EOM are normal. Pupils are equal, round, and reactive to light.  Neck: Normal range of motion. Neck supple. No JVD present. No tracheal deviation present. No thyromegaly present.  No nuchal rigidity, supple, mild SCM tenderness to left side  Cardiovascular: Normal rate, regular rhythm, normal heart sounds and intact distal pulses.  Exam reveals no gallop and no friction rub.   No murmur heard. Pulmonary/Chest: Effort normal and breath sounds normal. No stridor. No respiratory distress. He has no wheezes. He has no rales. He exhibits no tenderness.  Abdominal: Soft. Bowel sounds are normal. He exhibits no distension and  no mass. There is no tenderness. There is no rebound and no guarding.  Musculoskeletal: Normal range of motion. He exhibits no edema and no tenderness.  Lymphadenopathy:    He has no cervical adenopathy.  Neurological: He is alert and oriented to person, place, and time. He has normal reflexes. No cranial nerve deficit. He exhibits normal muscle tone. Coordination normal.  Skin: Skin is dry. No rash noted. No erythema. No pallor.  Psychiatric: He has a normal mood and affect. His behavior is normal. Judgment and thought  content normal.    ED Course  Procedures (including critical care time) Labs Review Labs Reviewed  URINALYSIS, ROUTINE W REFLEX MICROSCOPIC - Abnormal; Notable for the following:    Specific Gravity, Urine 1.037 (*)    Bilirubin Urine SMALL (*)    Ketones, ur 15 (*)    All other components within normal limits  WOUND CULTURE  CBC  BASIC METABOLIC PANEL  LIPASE, BLOOD  I-STAT TROPOININ, ED    Imaging Review No results found.   EKG Interpretation   Date/Time:  Friday January 22 2014 00:11:34 EDT Ventricular Rate:  58 PR Interval:  172 QRS Duration: 90 QT Interval:  398 QTC Calculation: 390 R Axis:   44 Text Interpretation:  Sinus bradycardia Cannot rule out Anterior infarct ,  age undetermined Abnormal ECG No old tracing to compare Confirmed by Ladora Osterberg   MD, [place] Churchill ([id number]) on 01/22/2014 3:04:39 AM     INCISION AND DRAINAGE Performed by: Olivia Mackie Consent: Verbal consent obtained. Risks and benefits: risks, benefits and alternatives were discussed Type: abscess  Body area: left ear  Anesthesia: local infiltration  18 gauge needle  Local anesthetic: lidocaine 2% without epinephrine  Anesthetic total: 2 ml  Complexity: simple  Drainage: purulent  Drainage amount: 2 cc  Packing material: 1/4 in iodoform gauze  Patient tolerance: Patient tolerated the procedure well with no immediate complications.    MDM   Final diagnoses:  Sebaceous cyst of ear  Nonintractable migraine, unspecified migraine type  Atypical chest pain    23 yo male with sebaceous cyst of ear.  Area drained with needle aspiration.  Pt notified he will most likely have recurrence, and recommend f/u with specialist for resection.  Ongoing headaches, will start on fioricet.  Low risk chest pain, negative ekg and troponin.  No signs of meningitis.    Olivia Mackie, MD 02/08/14 [id number]

## 2017-10-26 ENCOUNTER — Emergency Department (HOSPITAL_COMMUNITY)
Admission: EM | Admit: 2017-10-26 | Discharge: 2017-10-26 | Disposition: A | Discharge status: Home / Self Care | Payer: Self-pay | Attending: Emergency Medicine | Admitting: Emergency Medicine

## 2017-10-26 ENCOUNTER — Emergency Department (HOSPITAL_COMMUNITY): Payer: Self-pay

## 2017-10-26 ENCOUNTER — Encounter (HOSPITAL_COMMUNITY): Payer: Self-pay | Admitting: Emergency Medicine

## 2017-10-26 DIAGNOSIS — S0990XA Unspecified injury of head, initial encounter: Secondary | ICD-10-CM

## 2017-10-26 DIAGNOSIS — S50811A Abrasion of right forearm, initial encounter: Secondary | ICD-10-CM | POA: Insufficient documentation

## 2017-10-26 DIAGNOSIS — Y9389 Activity, other specified: Secondary | ICD-10-CM | POA: Insufficient documentation

## 2017-10-26 DIAGNOSIS — S80811A Abrasion, right lower leg, initial encounter: Secondary | ICD-10-CM | POA: Insufficient documentation

## 2017-10-26 DIAGNOSIS — W500XXA Accidental hit or strike by another person, initial encounter: Secondary | ICD-10-CM | POA: Insufficient documentation

## 2017-10-26 DIAGNOSIS — Y929 Unspecified place or not applicable: Secondary | ICD-10-CM | POA: Insufficient documentation

## 2017-10-26 DIAGNOSIS — S39012A Strain of muscle, fascia and tendon of lower back, initial encounter: Secondary | ICD-10-CM | POA: Insufficient documentation

## 2017-10-26 DIAGNOSIS — T148XXA Other injury of unspecified body region, initial encounter: Secondary | ICD-10-CM

## 2017-10-26 DIAGNOSIS — S80812A Abrasion, left lower leg, initial encounter: Secondary | ICD-10-CM | POA: Insufficient documentation

## 2017-10-26 DIAGNOSIS — Z23 Encounter for immunization: Secondary | ICD-10-CM | POA: Insufficient documentation

## 2017-10-26 DIAGNOSIS — Z87891 Personal history of nicotine dependence: Secondary | ICD-10-CM | POA: Insufficient documentation

## 2017-10-26 DIAGNOSIS — S060X1A Concussion with loss of consciousness of 30 minutes or less, initial encounter: Secondary | ICD-10-CM | POA: Insufficient documentation

## 2017-10-26 DIAGNOSIS — S060X9A Concussion with loss of consciousness of unspecified duration, initial encounter: Secondary | ICD-10-CM

## 2017-10-26 DIAGNOSIS — Y998 Other external cause status: Secondary | ICD-10-CM | POA: Insufficient documentation

## 2017-10-26 MED ORDER — OXYCODONE-ACETAMINOPHEN 5-325 MG PO TABS
1.0000 | ORAL_TABLET | Freq: Once | ORAL | Status: AC
  Administered 2017-10-26: 1 via ORAL
  Filled 2017-10-26: qty 1

## 2017-10-26 MED ORDER — TETANUS-DIPHTH-ACELL PERTUSSIS 5-2.5-18.5 LF-MCG/0.5 IM SUSP
0.5000 mL | Freq: Once | INTRAMUSCULAR | Status: AC
  Administered 2017-10-26: 0.5 mL via INTRAMUSCULAR
  Filled 2017-10-26: qty 0.5

## 2017-10-26 MED ORDER — IBUPROFEN 800 MG PO TABS
800.0000 mg | ORAL_TABLET | Freq: Once | ORAL | Status: AC
  Administered 2017-10-26: 800 mg via ORAL
  Filled 2017-10-26: qty 1

## 2017-10-26 MED ORDER — CYCLOBENZAPRINE HCL 5 MG PO TABS: 5.0000 mg | ORAL_TABLET | Freq: Three times a day (TID) | ORAL | 0 refills | Status: DC | PRN

## 2017-10-26 MED ORDER — CYCLOBENZAPRINE HCL 10 MG PO TABS
5.0000 mg | ORAL_TABLET | Freq: Once | ORAL | Status: AC
  Administered 2017-10-26: 5 mg via ORAL
  Filled 2017-10-26: qty 1

## 2017-10-26 MED ORDER — NAPROXEN 500 MG PO TABS: 500.0000 mg | ORAL_TABLET | Freq: Two times a day (BID) | ORAL | 0 refills | Status: DC | PRN
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
## 2017-10-26 NOTE — Discharge Instructions (Addendum)
Use ice, tylenol and naproxen as needed for pain.  If you were given medicines take as directed.  If you are on coumadin or contraceptives realize their levels and effectiveness is altered by many different medicines.  If you have any reaction (rash, tongues swelling, other) to the medicines stop taking and see a physician.    If your blood pressure was elevated in the ER make sure you follow up for management with a primary doctor or return for chest pain, shortness of breath or stroke symptoms.  Please follow up as directed and return to the ER or see a physician for new or worsening symptoms.  Thank you. Vitals:   10/26/17 1723 10/26/17 2057  BP: 122/85 126/85  Pulse: 91 71  Resp: 18 18  Temp: 98.3 F (36.8 C) 97.8 F (36.6 C)  TempSrc: Oral Oral  SpO2: 98% 100%

## 2018-07-09 ENCOUNTER — Emergency Department (HOSPITAL_COMMUNITY)
Admission: EM | Admit: 2018-07-09 | Discharge: 2018-07-09 | Disposition: A | Discharge status: Home / Self Care | Payer: Self-pay | Attending: Emergency Medicine | Admitting: Emergency Medicine

## 2018-07-09 ENCOUNTER — Other Ambulatory Visit: Payer: Self-pay

## 2018-07-09 ENCOUNTER — Encounter (HOSPITAL_COMMUNITY): Payer: Self-pay | Admitting: *Deleted

## 2018-07-09 DIAGNOSIS — Z87891 Personal history of nicotine dependence: Secondary | ICD-10-CM | POA: Insufficient documentation

## 2018-07-09 DIAGNOSIS — G43009 Migraine without aura, not intractable, without status migrainosus: Secondary | ICD-10-CM | POA: Insufficient documentation

## 2018-07-09 DIAGNOSIS — Z79899 Other long term (current) drug therapy: Secondary | ICD-10-CM | POA: Insufficient documentation

## 2018-07-09 MED ORDER — PROCHLORPERAZINE EDISYLATE 10 MG/2ML IJ SOLN
5.0000 mg | Freq: Once | INTRAMUSCULAR | Status: AC
  Administered 2018-07-09: 5 mg via INTRAVENOUS
  Filled 2018-07-09: qty 2

## 2018-07-09 MED ORDER — DIPHENHYDRAMINE HCL 50 MG/ML IJ SOLN
12.5000 mg | Freq: Once | INTRAMUSCULAR | Status: AC
  Administered 2018-07-09: 12.5 mg via INTRAVENOUS
  Filled 2018-07-09: qty 1

## 2018-07-09 MED ORDER — SODIUM CHLORIDE 0.9 % IV BOLUS
1000.0000 mL | Freq: Once | INTRAVENOUS | Status: AC
  Administered 2018-07-09: 1000 mL via INTRAVENOUS

## 2018-07-09 NOTE — ED Provider Notes (Signed)
[facility] EMERGENCY DEPARTMENT Provider Note   CSN: [id number] Arrival date & time: 07/09/18  1726     History   Chief Complaint Chief Complaint  Patient presents with  . Headache    HPI Devin Graham. is a 27 y.o. male who presents to ED for 8-hour history of migraine headache.  States the pain feels aching, located on the top of the head with associated photophobia, phonophobia and 2 episodes of nonbloody, nonbilious emesis.  States that he is gotten migraines in the past that have felt similar.  However, he states that he does not get them often.  He has not taken any medications to help with his symptoms.  Denies any head injuries or falls, numbness in arms or legs, vision changes, anticoagulant use, personal or family history of aneurysms.  HPI  History reviewed. No pertinent past medical history.  Patient Active Problem List   Diagnosis Date Noted  . SHOULDER PAIN, LEFT 03/05/2008  . CERVICAL STRAIN, ACUTE 03/05/2008  . EPIGASTRIC PAIN 02/25/2008  . GERD 05/13/2007    Past Surgical History:  Procedure Laterality Date  . ADENOIDECTOMY    . HERNIA REPAIR          Home Medications    Prior to Admission medications   Medication Sig Start Date End Date Taking? Authorizing Provider  cyclobenzaprine (FLEXERIL) 5 MG tablet Take 1 tablet (5 mg total) by mouth 3 (three) times daily as needed for muscle spasms. 10/26/17   Blane Ohara, MD  doxycycline (VIBRAMYCIN) 100 MG capsule Take 1 capsule (100 mg total) by mouth 2 (two) times daily. One po bid x 7 days 01/25/14   Purvis Sheffield, MD  naproxen (NAPROSYN) 500 MG tablet Take 1 tablet (500 mg total) by mouth 2 (two) times daily as needed. 10/26/17   Blane Ohara, MD  ondansetron (ZOFRAN ODT) 4 MG disintegrating tablet 4mg  ODT q4 hours prn nausea/vomit 01/25/14   Purvis Sheffield, MD    Family History History reviewed. No pertinent family history.  Social History Social History   Tobacco  Use  . Smoking status: Former Games developer  . Smokeless tobacco: Never Used  Substance Use Topics  . Alcohol use: Yes  . Drug use: No     Allergies   Patient has no known allergies.   Review of Systems Review of Systems  Constitutional: Negative for appetite change, chills and fever.  HENT: Negative for ear pain, rhinorrhea, sneezing and sore throat.   Eyes: Positive for photophobia. Negative for visual disturbance.  Respiratory: Negative for cough, chest tightness, shortness of breath and wheezing.   Cardiovascular: Negative for chest pain and palpitations.  Gastrointestinal: Positive for nausea and vomiting. Negative for abdominal pain, blood in stool, constipation and diarrhea.  Genitourinary: Negative for dysuria, hematuria and urgency.  Musculoskeletal: Negative for myalgias.  Skin: Negative for rash.  Neurological: Positive for headaches. Negative for dizziness, weakness and light-headedness.     Physical Exam Updated Vital Signs BP 118/81 (BP Location: Right Arm)   Pulse 99   Temp 98.6 F (37 C) (Oral)   Resp 16   Ht 5\' 9"  (1.753 m)   Wt 77.1 kg   SpO2 97%   BMI 25.10 kg/m   Physical Exam  Constitutional: He is oriented to person, place, and time. He appears well-developed and well-nourished. No distress.  HENT:  Head: Normocephalic and atraumatic.  Nose: Nose normal.  Eyes: Pupils are equal, round, and reactive to light. Conjunctivae and EOM are normal.  Right eye exhibits no discharge. Left eye exhibits no discharge. No scleral icterus.  Neck: Normal range of motion. Neck supple.  No meningismus.  Cardiovascular: Normal rate, regular rhythm, normal heart sounds and intact distal pulses. Exam reveals no gallop and no friction rub.  No murmur heard. Pulmonary/Chest: Effort normal and breath sounds normal. No respiratory distress.  Abdominal: Soft. Bowel sounds are normal. He exhibits no distension. There is no tenderness. There is no guarding.  Musculoskeletal:  Normal range of motion. He exhibits no edema.  Neurological: He is alert and oriented to person, place, and time. No cranial nerve deficit or sensory deficit. He exhibits normal muscle tone. Coordination normal.  Pupils reactive. No facial asymmetry noted. Cranial nerves appear grossly intact. Sensation intact to light touch on face, BUE and BLE. Strength 5/5 in BUE and BLE.   Skin: Skin is warm and dry. No rash noted.  Psychiatric: He has a normal mood and affect.  Nursing note and vitals reviewed.    ED Treatments / Results  Labs (all labs ordered are listed, but only abnormal results are displayed) Labs Reviewed - No data to display  EKG None  Radiology No results found.  Procedures Procedures (including critical care time)  Medications Ordered in ED Medications  sodium chloride 0.9 % bolus 1,000 mL (1,000 mLs Intravenous New Bag/Given 07/09/18 1827)  prochlorperazine (COMPAZINE) injection 5 mg (5 mg Intravenous Given 07/09/18 1828)  diphenhydrAMINE (BENADRYL) injection 12.5 mg (12.5 mg Intravenous Given 07/09/18 1828)     Initial Impression / Assessment and Plan / ED Course  I have reviewed the triage vital signs and the nursing notes.  Pertinent labs & imaging results that were available during my care of the patient were reviewed by me and considered in my medical decision making (see chart for details).     27 year old male with a past medical history of migraines presents to ED for migraine that began after he woke up.  Pain is located on top of his head, with associated nonbloody, nonbilious emesis, photophobia and phonophobia which is typical of his migraines.  States that he does not get migraines often.  No head injuries or falls, numbness in arms or legs, vision changes, personal family history of aneurysms.  On exam patient is overall well-appearing.  No deficits on neurological exam noted.  No meningismus.  He is afebrile.  Patient given migraine cocktail with  significant improvement in his symptoms. There are no headache characteristics that are lateralizing or concerning for increased ICP, infectious or vascular cause of his symptoms.  No imaging warranted at this time based on unremarkable history, physical exam findings and improvement in symptoms.  Patient is agreeable for discharge home and continuing over-the-counter medications.  Will advise him to return to ED for any severe worsening symptoms.  Patient is hemodynamically stable, in NAD, and able to ambulate in the ED. Evaluation does not show pathology that would require ongoing emergent intervention or inpatient treatment. I explained the diagnosis to the patient. Pain has been managed and has no complaints prior to discharge. Patient is comfortable with above plan and is stable for discharge at this time. All questions were answered prior to disposition. Strict return precautions for returning to the ED were discussed. Encouraged follow up with PCP.    Portions of this note were generated with Scientist, clinical (histocompatibility and immunogenetics). Dictation errors may occur despite best attempts at proofreading.  Final Clinical Impressions(s) / ED Diagnoses   Final diagnoses:  Migraine without aura and  without status migrainosus, not intractable    ED Discharge Orders    None       Dietrich PatesKhatri, Aniesha Haughn, PA-C 07/09/18 Vertell [id number]    Pickering, Nathan, MD 07/15/18 0006

## 2018-07-09 NOTE — Discharge Instructions (Signed)
Return to ED for worsening symptoms, head injuries or falls, numbness in arms or legs, vision changes, trouble walking.

## 2018-07-09 NOTE — ED Triage Notes (Signed)
Pt reports his HA started this AM. Pt haS NOT TAKEN ANY MEDS FOR ha. PT ALSO REPORTS HE VOMITED X2  Today.

## 2019-04-30 DIAGNOSIS — L0390 Cellulitis, unspecified: Secondary | ICD-10-CM

## 2019-04-30 HISTORY — DX: Cellulitis, unspecified: L03.90

## 2019-05-06 ENCOUNTER — Inpatient Hospital Stay (HOSPITAL_COMMUNITY)
Admission: EM | Admit: 2019-05-06 | Discharge: 2019-05-10 | DRG: 872 | Disposition: A | Discharge status: Home / Self Care | Payer: Self-pay | Attending: Family Medicine | Admitting: Family Medicine

## 2019-05-06 ENCOUNTER — Encounter (HOSPITAL_COMMUNITY): Payer: Self-pay | Admitting: Otolaryngology

## 2019-05-06 ENCOUNTER — Emergency Department (HOSPITAL_COMMUNITY): Payer: Self-pay

## 2019-05-06 ENCOUNTER — Other Ambulatory Visit: Payer: Self-pay

## 2019-05-06 DIAGNOSIS — R52 Pain, unspecified: Secondary | ICD-10-CM

## 2019-05-06 DIAGNOSIS — K5900 Constipation, unspecified: Secondary | ICD-10-CM | POA: Diagnosis present

## 2019-05-06 DIAGNOSIS — K219 Gastro-esophageal reflux disease without esophagitis: Secondary | ICD-10-CM | POA: Diagnosis present

## 2019-05-06 DIAGNOSIS — L03211 Cellulitis of face: Secondary | ICD-10-CM | POA: Diagnosis present

## 2019-05-06 DIAGNOSIS — A419 Sepsis, unspecified organism: Principal | ICD-10-CM | POA: Diagnosis present

## 2019-05-06 DIAGNOSIS — Z20828 Contact with and (suspected) exposure to other viral communicable diseases: Secondary | ICD-10-CM | POA: Diagnosis present

## 2019-05-06 DIAGNOSIS — F17210 Nicotine dependence, cigarettes, uncomplicated: Secondary | ICD-10-CM | POA: Diagnosis present

## 2019-05-06 DIAGNOSIS — F419 Anxiety disorder, unspecified: Secondary | ICD-10-CM | POA: Diagnosis present

## 2019-05-06 DIAGNOSIS — L0390 Cellulitis, unspecified: Secondary | ICD-10-CM | POA: Diagnosis present

## 2019-05-06 HISTORY — DX: Anxiety disorder, unspecified: F41.9

## 2019-05-06 LAB — CBC WITH DIFFERENTIAL/PLATELET
Abs Immature Granulocytes: 0.05 10*3/uL (ref 0.00–0.07)
Basophils Absolute: 0.1 10*3/uL (ref 0.0–0.1)
Basophils Relative: 0 %
Eosinophils Absolute: 0 10*3/uL (ref 0.0–0.5)
Eosinophils Relative: 0 %
HCT: 47 % (ref 39.0–52.0)
Hemoglobin: 16.2 g/dL (ref 13.0–17.0)
Immature Granulocytes: 0 %
Lymphocytes Relative: 13 %
Lymphs Abs: 2 10*3/uL (ref 0.7–4.0)
MCH: 30.9 pg (ref 26.0–34.0)
MCHC: 34.5 g/dL (ref 30.0–36.0)
MCV: 89.7 fL (ref 80.0–100.0)
Monocytes Absolute: 1.2 10*3/uL — ABNORMAL HIGH (ref 0.1–1.0)
Monocytes Relative: 8 %
Neutro Abs: 12.2 10*3/uL — ABNORMAL HIGH (ref 1.7–7.7)
Neutrophils Relative %: 79 %
Platelets: 234 10*3/uL (ref 150–400)
RBC: 5.24 MIL/uL (ref 4.22–5.81)
RDW: 11.9 % (ref 11.5–15.5)
WBC: 15.6 10*3/uL — ABNORMAL HIGH (ref 4.0–10.5)
nRBC: 0 % (ref 0.0–0.2)

## 2019-05-06 LAB — COMPREHENSIVE METABOLIC PANEL
ALT: 21 U/L (ref 0–44)
AST: 18 U/L (ref 15–41)
Albumin: 4.5 g/dL (ref 3.5–5.0)
Alkaline Phosphatase: 100 U/L (ref 38–126)
Anion gap: 13 (ref 5–15)
BUN: 8 mg/dL (ref 6–20)
CO2: 22 mmol/L (ref 22–32)
Calcium: 9.3 mg/dL (ref 8.9–10.3)
Chloride: 100 mmol/L (ref 98–111)
Creatinine, Ser: 0.92 mg/dL (ref 0.61–1.24)
GFR calc Af Amer: >60 mL/min (ref >60)
GFR calc non Af Amer: >60 mL/min (ref >60)
Glucose, Bld: 121 mg/dL — ABNORMAL HIGH (ref 70–99)
Potassium: 3.6 mmol/L (ref 3.5–5.1)
Sodium: 135 mmol/L (ref 135–145)
Total Bilirubin: 2.9 mg/dL — ABNORMAL HIGH (ref 0.3–1.2)
Total Protein: 8.1 g/dL (ref 6.5–8.1)

## 2019-05-06 LAB — LACTIC ACID, PLASMA
Lactic Acid, Venous: 1.4 mmol/L (ref 0.5–1.9)
Lactic Acid, Venous: 1.1 mmol/L (ref 0.5–1.9)

## 2019-05-06 LAB — SARS CORONAVIRUS 2 BY RT PCR (HOSPITAL ORDER, PERFORMED IN ~~LOC~~ HOSPITAL LAB): SARS Coronavirus 2: NEGATIVE

## 2019-05-06 MED ORDER — IOHEXOL 300 MG/ML  SOLN
75.0000 mL | Freq: Once | INTRAMUSCULAR | Status: AC
  Administered 2019-05-06: 75 mL via INTRAVENOUS

## 2019-05-06 MED ORDER — ACETAMINOPHEN 650 MG RE SUPP: 650.0000 mg | Freq: Four times a day (QID) | RECTAL | Status: DC | PRN

## 2019-05-06 MED ORDER — CLINDAMYCIN PHOSPHATE 600 MG/50ML IV SOLN
600.0000 mg | Freq: Once | INTRAVENOUS | Status: AC
  Administered 2019-05-06: 600 mg via INTRAVENOUS
  Filled 2019-05-06: qty 50

## 2019-05-06 MED ORDER — ONDANSETRON HCL 4 MG PO TABS: 4.0000 mg | ORAL_TABLET | Freq: Four times a day (QID) | ORAL | Status: DC | PRN

## 2019-05-06 MED ORDER — HYDROMORPHONE HCL 1 MG/ML IJ SOLN
0.5000 mg | Freq: Once | INTRAMUSCULAR | Status: AC
  Administered 2019-05-06: 0.5 mg via INTRAVENOUS
  Filled 2019-05-06: qty 1

## 2019-05-06 MED ORDER — SODIUM CHLORIDE 0.9 % IV BOLUS
1000.0000 mL | Freq: Once | INTRAVENOUS | Status: AC
  Administered 2019-05-06: 1000 mL via INTRAVENOUS

## 2019-05-06 MED ORDER — IBUPROFEN 200 MG PO TABS
600.0000 mg | ORAL_TABLET | Freq: Four times a day (QID) | ORAL | Status: DC | PRN
  Administered 2019-05-06 – 2019-05-10 (×6): 600 mg via ORAL
  Filled 2019-05-06 (×7): qty 3

## 2019-05-06 MED ORDER — ENOXAPARIN SODIUM 40 MG/0.4ML ~~LOC~~ SOLN
40.0000 mg | SUBCUTANEOUS | Status: DC
  Administered 2019-05-06 – 2019-05-09 (×4): 40 mg via SUBCUTANEOUS
  Filled 2019-05-06 (×5): qty 0.4

## 2019-05-06 MED ORDER — ENSURE ENLIVE PO LIQD
237.0000 mL | Freq: Two times a day (BID) | ORAL | Status: DC
  Administered 2019-05-07 – 2019-05-10 (×5): 237 mL via ORAL
  Filled 2019-05-06 (×2): qty 237

## 2019-05-06 MED ORDER — VANCOMYCIN HCL IN DEXTROSE 1-5 GM/200ML-% IV SOLN
1000.0000 mg | Freq: Three times a day (TID) | INTRAVENOUS | Status: AC
  Administered 2019-05-07 (×2): 1000 mg via INTRAVENOUS
  Filled 2019-05-06 (×2): qty 200

## 2019-05-06 MED ORDER — OXYCODONE HCL 5 MG PO TABS
5.0000 mg | ORAL_TABLET | ORAL | Status: AC | PRN
  Administered 2019-05-06 – 2019-05-07 (×4): 5 mg via ORAL
  Filled 2019-05-06 (×3): qty 1

## 2019-05-06 MED ORDER — ACETAMINOPHEN 325 MG PO TABS
650.0000 mg | ORAL_TABLET | Freq: Four times a day (QID) | ORAL | Status: DC | PRN
  Administered 2019-05-06 – 2019-05-08 (×4): 650 mg via ORAL
  Filled 2019-05-06 (×4): qty 2

## 2019-05-06 MED ORDER — PIPERACILLIN-TAZOBACTAM 3.375 G IVPB
3.3750 g | Freq: Three times a day (TID) | INTRAVENOUS | Status: DC
  Administered 2019-05-06 – 2019-05-07 (×2): 3.375 g via INTRAVENOUS
  Filled 2019-05-06 (×2): qty 50

## 2019-05-06 MED ORDER — VANCOMYCIN HCL 10 G IV SOLR
1500.0000 mg | Freq: Once | INTRAVENOUS | Status: AC
  Administered 2019-05-06: 1500 mg via INTRAVENOUS
  Filled 2019-05-06: qty 1500

## 2019-05-06 MED ORDER — ACETAMINOPHEN 325 MG PO TABS
650.0000 mg | ORAL_TABLET | Freq: Once | ORAL | Status: AC
  Administered 2019-05-06: 650 mg via ORAL

## 2019-05-06 MED ORDER — ONDANSETRON HCL 4 MG/2ML IJ SOLN: 4.0000 mg | Freq: Four times a day (QID) | INTRAMUSCULAR | Status: DC | PRN

## 2019-05-06 MED ORDER — NICOTINE 21 MG/24HR TD PT24
21.0000 mg | MEDICATED_PATCH | Freq: Every day | TRANSDERMAL | Status: DC
  Administered 2019-05-06 – 2019-05-10 (×3): 21 mg via TRANSDERMAL
  Filled 2019-05-06 (×4): qty 1

## 2019-05-06 MED ORDER — HYDROXYZINE HCL 25 MG PO TABS
25.0000 mg | ORAL_TABLET | ORAL | Status: DC | PRN
Start: 2019-05-06 — End: 2019-05-10
  Administered 2019-05-08 – 2019-05-09 (×4): 25 mg via ORAL
  Filled 2019-05-06 (×6): qty 1

## 2019-05-06 NOTE — ED Provider Notes (Signed)
[facility] EMERGENCY DEPARTMENT Provider Note   CSN: [id number] Arrival date & time: 05/06/19  2094     History   Chief Complaint No chief complaint on file.   HPI Ransom Nickson. is a 28 y.o. male.     HPI   28 year old male presents today with complaints of facial swelling.  Patient notes 2 days ago he developed swelling to his left cheek and face.  He notes this is rapidly progressed over the last several days.  He notes extreme pain, difficulty opening and closing his mouth secondary to discomfort, he notes some minor difficulty breathing, tolerating secretions.  No history of the same, no trauma.  No fever.  He denies any history of MRSA.  He denies any significant past medical history.  He does not have a primary care provider.  No past medical history on file.  Patient Active Problem List   Diagnosis Date Noted  . Cellulitis 05/06/2019  . SHOULDER PAIN, LEFT 03/05/2008  . CERVICAL STRAIN, ACUTE 03/05/2008  . EPIGASTRIC PAIN 02/25/2008  . GERD 05/13/2007    Past Surgical History:  Procedure Laterality Date  . ADENOIDECTOMY    . HERNIA REPAIR          Home Medications    Prior to Admission medications   Not on File    Family History No family history on file.  Social History Social History   Tobacco Use  . Smoking status: Former Research scientist (life sciences)  . Smokeless tobacco: Never Used  Substance Use Topics  . Alcohol use: Yes  . Drug use: No     Allergies   Patient has no known allergies.   Review of Systems Review of Systems  All other systems reviewed and are negative.    Physical Exam Updated Vital Signs BP 112/76   Pulse 95   Temp 99.8 F (37.7 C) (Oral)   Resp 14   SpO2 100%   Physical Exam Vitals signs and nursing note reviewed.  Constitutional:      Appearance: He is well-developed.  HENT:     Head: Normocephalic and atraumatic.     Comments: Large soft tissue swelling noted over the left cheek with extreme  tenderness palpation, very minimal tenderness palpation to the left lower gumline, unable to fully open jaw secondary to discomfort, no pooling of secretions, floor the mouth is soft neck is supple, voice is normal Eyes:     General: No scleral icterus.       Right eye: No discharge.        Left eye: No discharge.     Conjunctiva/sclera: Conjunctivae normal.     Pupils: Pupils are equal, round, and reactive to light.  Neck:     Musculoskeletal: Normal range of motion.     Vascular: No JVD.     Trachea: No tracheal deviation.  Pulmonary:     Effort: Pulmonary effort is normal.     Breath sounds: No stridor.  Neurological:     Mental Status: He is alert and oriented to person, place, and time.     Coordination: Coordination normal.  Psychiatric:        Behavior: Behavior normal.        Thought Content: Thought content normal.        Judgment: Judgment normal.      ED Treatments / Results  Labs (all labs ordered are listed, but only abnormal results are displayed) Labs Reviewed  COMPREHENSIVE METABOLIC PANEL - Abnormal; Notable for  the following components:      Result Value   Glucose, Bld 121 (*)    Total Bilirubin 2.9 (*)    All other components within normal limits  CBC WITH DIFFERENTIAL/PLATELET - Abnormal; Notable for the following components:   WBC 15.6 (*)    Neutro Abs 12.2 (*)    Monocytes Absolute 1.2 (*)    All other components within normal limits  SARS CORONAVIRUS 2 (HOSPITAL ORDER, PERFORMED IN [facility])  CULTURE, BLOOD (ROUTINE X 2)  CULTURE, BLOOD (ROUTINE X 2)  LACTIC ACID, PLASMA  LACTIC ACID, PLASMA    EKG None  Radiology Ct Soft Tissue Neck W Contrast  Result Date: 05/06/2019 CLINICAL DATA:  Left facial swelling and pain. Elevated white blood count. EXAM: CT NECK WITH CONTRAST TECHNIQUE: Multidetector CT imaging of the neck was performed using the standard protocol following the bolus administration of intravenous contrast.  CONTRAST:  75 mL Omnipaque 300 IV COMPARISON:  None. FINDINGS: Pharynx and larynx: Normal. No mass or swelling. Salivary glands: No inflammation, mass, or stone. Thyroid: Negative Lymph nodes: Scattered lymph nodes in the neck. No pathologic adenopathy. Right level 2 lymph node 14 mm. Left level 2 lymph node 8 mm. Vascular: Normal vascular enhancement. Limited intracranial: Negative Visualized orbits: Negative Mastoids and visualized paranasal sinuses: Mucosal edema paranasal sinuses without air-fluid level. Skeleton: No acute skeletal abnormality. No dental abscess is present on the left. Caries right upper third molar. Upper chest: Lung apices clear bilaterally Other: Extensive soft tissue swelling left face. This is mostly in the subcutaneous tissues with edema extending down to the platysmas muscle. No soft tissue abscess. IMPRESSION: Extensive soft tissue swelling left face predominantly in the subcutaneous tissues. No soft tissue abscess. No evidence of dental source. This may represent extensive cellulitis without abscess. Mild mucosal edema paranasal sinuses These results were called by telephone at the time of interpretation on 05/06/2019 at 10:37 am to provider Kadeshia Kasparian , who verbally acknowledged these results. Electronically Signed   By: Marlan Palau M.D.   On: 05/06/2019 10:38    Procedures Procedures (including critical care time)  Medications Ordered in ED Medications  hydrOXYzine (ATARAX/VISTARIL) tablet 25 mg (has no administration in time range)  HYDROmorphone (DILAUDID) injection 0.5 mg (0.5 mg Intravenous Given 05/06/19 0935)  sodium chloride 0.9 % bolus 1,000 mL (0 mLs Intravenous Stopped 05/06/19 1043)  iohexol (OMNIPAQUE) 300 MG/ML solution 75 mL (75 mLs Intravenous Contrast Given 05/06/19 1014)  sodium chloride 0.9 % bolus 1,000 mL (0 mLs Intravenous Stopped 05/06/19 1328)  HYDROmorphone (DILAUDID) injection 0.5 mg (0.5 mg Intravenous Given 05/06/19 1146)  clindamycin  (CLEOCIN) IVPB 600 mg (0 mg Intravenous Stopped 05/06/19 1210)  acetaminophen (TYLENOL) tablet 650 mg (650 mg Oral Given 05/06/19 1209)     Initial Impression / Assessment and Plan / ED Course  I have reviewed the triage vital signs and the nursing notes.  Pertinent labs & imaging results that were available during my care of the patient were reviewed by me and considered in my medical decision making (see chart for details).        27 year old male presents today with cellulitis of his face.  Patient has severe pain and significant swelling to the face.  This is fairly rapid in nature.  His CT shows no drainable abscess but rather extensive cellulitis.  Given patient's presentation with fever tachycardia he will require observation with antibiotics, pain medication to assure improvement in symptoms prior to disposition home.  Hospitalist service will be consulted.   Family medicine agreed to hospital admission   Final Clinical Impressions(s) / ED Diagnoses   Final diagnoses:  Cellulitis of face    ED Discharge Orders    None       Rosalio LoudHedges, Persis Graffius, PA-C 05/06/19 1442    Alvira MondaySchlossman, Erin, MD 05/06/19 2118

## 2019-05-06 NOTE — Consult Note (Signed)
OTOLARYNGOLOGY CONSULTATION    Primary Care Physician: Patient, No Pcp Per Patient Location at Initial Consult: Emergency Department Chief Complaint/Reason for Consult: left facial cellulitis  History of Presenting Illness:    Devin Graham. is a  28 y.o. male presenting with  Left facial swelling.  This began rather quickly a couple of days ago with a little pimple right next his nasolabial fold on the left side.  He tried to pop it during this and then the swelling began pretty severely right after.  Never anything like this in the past.  No ongoing odontogenic issues.  No trouble eating drinking or swallowing.  He can open his mouth completely though it is little bit painful.  No history of recurrent abscesses.  History reviewed. No pertinent past medical history.  Past Surgical History:  Procedure Laterality Date  . ADENOIDECTOMY    . HERNIA REPAIR      History reviewed. No pertinent family history.  Social History   Socioeconomic History  . Marital status: Single    Spouse name: Not on file  . Number of children: Not on file  . Years of education: Not on file  . Highest education level: Not on file  Occupational History  . Not on file  Social Needs  . Financial resource strain: Not on file  . Food insecurity    Worry: Not on file    Inability: Not on file  . Transportation needs    Medical: Not on file    Non-medical: Not on file  Tobacco Use  . Smoking status: Former Games developer  . Smokeless tobacco: Never Used  Substance and Sexual Activity  . Alcohol use: Yes  . Drug use: No  . Sexual activity: Not on file  Lifestyle  . Physical activity    Days per week: Not on file    Minutes per session: Not on file  . Stress: Not on file  Relationships  . Social Musician on phone: Not on file    Gets together: Not on file    Attends religious service: Not on file    Active member of club or organization: Not on file    Attends meetings of clubs or  organizations: Not on file    Relationship status: Not on file  Other Topics Concern  . Not on file  Social History Narrative  . Not on file    No current facility-administered medications on file prior to encounter.    No current outpatient medications on file prior to encounter.    No Known Allergies   Review of Systems: ROS complete and negative except the above   OBJECTIVE: Vital Signs: Vitals:   05/06/19 1315 05/06/19 1330  BP: 114/63 112/76  Pulse: 92 95  Resp: (!) 21 14  Temp:    SpO2: 95% 100%    I&O  Intake/Output Summary (Last 24 hours) at 05/06/2019 1731 Last data filed at 05/06/2019 1328 Gross per 24 hour  Intake 2050 ml  Output -  Net 2050 ml    Physical Exam General: Well developed, well nourished. No acute distress. Voice without dysphonia.   Head/Face:  The left malar eminence exhibits gross edema and erythema with a pinpoint worsening area of erythema next to a small sore adjacent to the left nasolabial fold.  Boggy erythema and edema extends throughout the malar eminence and onto the buccal fat pad.  Bimanual palpation of the left cheek demonstrates no fluctuance.  Cellulitic area is  marked, as is the region of edema  Eyes: Globes well positioned, no proptosis, lower lid on the left is a little bit swollen but there is no globe involvement with this infection. Lids: No periorbital edema/ecchymosis. No lid laceration Conjunctiva: No chemosis, hemorrhage PERRL Extra occular movement: Full ROM bilaterally. No gaze restriction    Ears: No gross deformity. Normal external canal. Tympanic membrane intact bilaterally and without effusion  Hearing:  Normal speech reception.  Nose: No gross deformity or lesions. No purulent discharge. Septum midline. No turbinate hypertrophy.  Mouth/Oropharynx: Lips without any lesions. Dentition normal. No mucosal lesions within the oropharynx. No tonsillar enlargement, exudate, or lesions. Pharyngeal walls symmetrical.  Uvula midline. Tongue midline without lesions.  Neck: Trachea midline. No masses. No thyromegaly or nodules palpated. No crepitus.  Lymphatic: No lymphadenopathy in the neck.  Respiratory: No stridor or distress.  Cardiovascular: Regular rate and rhythm.  Extremities: No edema or cyanosis. Warm and well-perfused.  Skin: No scars or lesions on face or neck.  Neurologic: CN II-XII intact. Moving all extremities without gross abnormality.  Other:      Labs: Lab Results  Component Value Date   WBC 15.6 (H) 05/06/2019   HGB 16.2 05/06/2019   HCT 47.0 05/06/2019   PLT 234 05/06/2019   ALT 21 05/06/2019   AST 18 05/06/2019   NA 135 05/06/2019   K 3.6 05/06/2019   CL 100 05/06/2019   CREATININE 0.92 05/06/2019   BUN 8 05/06/2019   CO2 22 05/06/2019   TSH 0.43 10/10/2006     Review of Ancillary Data / Diagnostic Tests: CT neck w contrast, soft tissue stranding noted over left malar eminence. No obvious odontogenic source  ASSESSMENT:  28 y.o. male with left facial cellulitis, no drainable abscess noted. Medical management should be sufficient for this.   RECOMMENDATIONS: -Agree with IV antibiotics per primary team -Alternate applying heat and cold for comfort. -Follow markings of cellulitis to monitor for worsening.  -Re-engage ENT if clinically concern for worsening in 1-2 days    Gavin Pound, MD  [facility], [place] Office phone [phone]

## 2019-05-06 NOTE — Progress Notes (Signed)
CALL PAGER [phone] for any questions or notifications regarding this patient  FMTS Attending Note: Dorcas Mcmurray MDPlease see resident's H and P for details. I have examined and discussed with resident. His facial cellulitis has been rapidly progressive, even since his admission to ED and our first picture (see media). His airway was not compromised but I am worried about the rapid progression. Will ask ENT to evaluate and be aware of him. Broaden to vancomycin and zosyn.Follow closely.

## 2019-05-06 NOTE — ED Triage Notes (Signed)
Patient complains of increasing pain and swelling to left side of face x 2 days, no trauma. Unsure if has broken teeth.

## 2019-05-06 NOTE — Progress Notes (Signed)
Pharmacy Antibiotic Note  Devin Graham. is a 28 y.o. male admitted on 05/06/2019 with facial cellulitis.  Pharmacy has been consulted for vancomycin and zosyn dosing.  Received one dose of clindamycin in ED.  WBC 15.6, AF, LA 1.1, SCr 0.92.    Plan: Vancomycin 1500mg  IV x1, then 1000mg  IV every 8 hours Goal AUC 400-550. Expected AUC: 497 SCr used: 0.92 Zosyn 3.375g IV every 8 hours Monitor renal function, clinical progression and LOT Vancomycin levels as needed     Temp (24hrs), Avg:99 F (37.2 C), Min:98.5 F (36.9 C), Max:99.8 F (37.7 C)  Recent Labs  Lab 05/06/19 0753 05/06/19 0933  WBC 15.6*  --   CREATININE 0.92  --   LATICACIDVEN 1.4 1.1    CrCl cannot be calculated (Unknown ideal weight.).    No Known Allergies  Devin Graham, PharmD Clinical Pharmacist Please check AMION for all [place] numbers 05/06/2019 6:01 PM

## 2019-05-06 NOTE — H&P (Addendum)
Family Medicine Teaching [facility] Admission History and Physical Service Pager: [phone]  Patient name: Devin CrawfordGerald A Railey Jr. Medical record number: [id number] Date of birth: 11-23-1990 Age: 28 y.o. Gender: male  Primary Care Provider: Patient, No Pcp Per Consultants: ENT Code Status: Full Preferred Emergency Contact: Tyson DenseBrandon Bridgers [phone]  Chief Complaint: facial swelling  Assessment and Plan: Devin CrawfordGerald A Teska Jr. is a 28 y.o. male presenting with facial swelling. PMH is significant for Adenoidectomy  Sepsis secondary to Lt Facial Cellulitis  Pt popped pimple yesterday, significant swelling and pain increased overnight.  Vital signs significant for tachycardia, HR 106. On exam significant left side facial edema with small scab lateral to lip line.  No crepitus noted.  Slight erythema noted around area of scab with mild induration. Left buccal swelling and edema.  No obvious dental erosion, non tender to palpation, no gingival redness, ulcers or open sores. no evidence of airway compromise, blurred vision or pain with extraocular movement.  Labs significant for WBC 15.6 with a neutrophilic shift. CT neck shows extensive soft tissue swelling of left face predominantly in the subcutaneous tissue with edema extending down to the platysmas muscle, no soft tissue abscess.  No evidence of dental source.  May represent extensive cellulitis without abscess.  Considered dental abscess but seems unliklely as CT shows no abscess and no tooth tenderness on palpation.  Retropharyngeal abscess or peritonsillar abscess less likely based on lack of physical exam findings or CT evidence.  Also considered dry socket as smoking can be contributing factor but less likely as no recent dental procedures. Also considered in differential Ludwig angina but less likely as edema is unilateral and no evidence of fever, drooling or dysphagia. Most likely etiology of cellulitis from bacterial infection, MRSA vs  polymicrobial. -Admit to Med-Surg, Attending Dr Jennette KettleNeal -Vancomycin IV (10/07-)  -Zosyn 3.375 ng IV q8h (10/07-) -Consult ENT, appreciate recs -monitor respiratory status  -Consider CCM consult if airway compromise -Ibuprofen 600mg  q6h prn -Oxycodone IR 5mg  q4h x24hrs -Monitor vitals every 4 hours  Chest pain likely secondary to anxiety Mild chest pressure noted at rest without radiation to arm, back, jaw.  Lasting under a minute before dissipating.  No recurrence.  No significant heart history.  Anxious appearing in exam room.  Likely secondary to anxiety in the setting of acute facial swelling. -Follow-up EKG -Atarax as needed  Nicotine use disorder Reports smoking 1.5 packs/day.  Also endorses marijuana. -Nicotine patch 21 mg daily  History of GERD No bothersome symptoms at present. -Continue to monitor  FEN/GI:  -Regular diet  Prophylaxis:  -Lovenox  Disposition: Admt Med-Surg, Attending Dr. Jennette KettleNeal  History of Present Illness:  Devin CrawfordGerald A Gratz Jr. is a 28 y.o. male presenting with Lt side facial swelling.  Pt states that he popped a pimple yesterday.  Had some drainage.  Went to bed last night and swelling in face was size of golf ball.  He states that 6 hours later he awoke with pain and his face had increased in size significantly.  He used warm compress but did not relieve pain.  Denies any difficulty swallowing, increasing shortness of breath.  He reports having some chest pain that was dull and did not radiate to back, arms or jaw.  He reports some nausea but no vomiting.  He denies any dysuria or diarrhea but does have some constipation.  He does not report any ear pain, neck pain, visual disturbances, headaches, or fever.  Reports no known sick contacts.   In  the ED he was tachycardic, HR 106, afebrile, RR 13 O2 sat 100% on room air.  He was given Clindamycin 600 mg IV x1, Hydromorphone 0.5mg  IV x2, Tylenol 650 mg x1, and N/S 1L Bolus x2 CT neck shows soft tissue  cellulitis. Blood cultures x2 obtained.    Review Of Systems: Per HPI with the following additions:   Review of Systems  Constitutional: Negative for chills and fever.  HENT: Negative for congestion and hearing loss.   Eyes: Negative for blurred vision, double vision and pain.  Respiratory: Positive for cough. Negative for hemoptysis.   Cardiovascular: Positive for chest pain.  Gastrointestinal: Positive for constipation and vomiting. Negative for blood in stool, diarrhea and nausea.  Genitourinary: Negative for dysuria.  Musculoskeletal: Positive for myalgias.  Skin: Positive for rash.  Neurological: Negative for dizziness and headaches.    Patient Active Problem List   Diagnosis Date Noted  . Cellulitis 05/06/2019  . SHOULDER PAIN, LEFT 03/05/2008  . CERVICAL STRAIN, ACUTE 03/05/2008  . EPIGASTRIC PAIN 02/25/2008  . GERD 05/13/2007    Past Medical History: History reviewed. No pertinent past medical history.  Past Surgical History: Past Surgical History:  Procedure Laterality Date  . ADENOIDECTOMY    . HERNIA REPAIR      Social History: Social History   Tobacco Use  . Smoking status: Former Research scientist (life sciences)  . Smokeless tobacco: Never Used  Substance Use Topics  . Alcohol use: Yes  . Drug use: No   Additional social history:Tobacco dependence, THC, ETOH (last drink 10/06) Please also refer to relevant sections of EMR.  Family History: History reviewed. No pertinent family history.   Allergies and Medications: No Known Allergies No current facility-administered medications on file prior to encounter.    No current outpatient medications on file prior to encounter.    Objective: BP 127/81 (BP Location: Left Arm)   Pulse 94   Temp 98.4 F (36.9 C) (Oral)   Resp 16   Ht 5\' 9"  (1.753 m)   Wt 81.6 kg   SpO2 99%   BMI 26.58 kg/m  Exam: General:Alert and oriented.  Anxious appearing. Eyes: left periorbital edema, EOMs intact, PERRLA, no pain with extraocular  movement.  No subjective blurred vision. ENTM: Bilateral TMs normal, no bulging, ear canals normal, no drainage, mucus membranes moist, no dental erosions or tenderness, no gingival redness, no ulcers or abrasions noted, Lt buccal edema  Neck: supple, no lymphadenopathy Cardiovascular: RRR, no murmurs appreciated Respiratory: CTAB, no crackles, no rhonchi.  Breathing comfortably on room air. Gastrointestinal: soft, non tender, non distended, BS present MSK: moves all extremities, no lower extremity edema Derm:warm and dry Neuro: orientated x4, CN II-XII intact Psych: appropriate and cooperative  Media Information    Document Information  Photos    05/06/2019 14:24  Attached To:  Hospital Encounter on 05/06/19  Source Information  Matilde Haymaker, MD  [facility]     Labs and Imaging: CBC BMET  Recent Labs  Lab 05/06/19 0753  WBC 15.6*  HGB 16.2  HCT 47.0  PLT 234   Recent Labs  Lab 05/06/19 0753  NA 135  K 3.6  CL 100  CO2 22  BUN 8  CREATININE 0.92  GLUCOSE 121*  CALCIUM 9.3       Ct Soft Tissue Neck W Contrast  Result Date: 05/06/2019 CLINICAL DATA:  Left facial swelling and pain. Elevated white blood count. EXAM: CT NECK WITH CONTRAST TECHNIQUE: Multidetector CT imaging of the neck was performed using  the standard protocol following the bolus administration of intravenous contrast. CONTRAST:  75 mL Omnipaque 300 IV COMPARISON:  None. FINDINGS: Pharynx and larynx: Normal. No mass or swelling. Salivary glands: No inflammation, mass, or stone. Thyroid: Negative Lymph nodes: Scattered lymph nodes in the neck. No pathologic adenopathy. Right level 2 lymph node 14 mm. Left level 2 lymph node 8 mm. Vascular: Normal vascular enhancement. Limited intracranial: Negative Visualized orbits: Negative Mastoids and visualized paranasal sinuses: Mucosal edema paranasal sinuses without air-fluid level. Skeleton: No acute skeletal abnormality. No dental abscess is present on  the left. Caries right upper third molar. Upper chest: Lung apices clear bilaterally Other: Extensive soft tissue swelling left face. This is mostly in the subcutaneous tissues with edema extending down to the platysmas muscle. No soft tissue abscess. IMPRESSION: Extensive soft tissue swelling left face predominantly in the subcutaneous tissues. No soft tissue abscess. No evidence of dental source. This may represent extensive cellulitis without abscess. Mild mucosal edema paranasal sinuses These results were called by telephone at the time of interpretation on 05/06/2019 at 10:37 am to provider JEFFREY HEDGES , who verbally acknowledged these results. Electronically Signed   By: Marlan Palau M.D.   On: 05/06/2019 10:38    Dana Allan, MD 05/06/2019, 8:46 PM PGY-1, [place] [facility] FPTS Intern pager: [phone], text pages welcome  FPTS Upper-Level Resident Addendum   I have independently interviewed and examined the patient. I have discussed the above with the original author and agree with their documentation. My edits for correction/addition/clarification are in blue. Please see also any attending notes.    Mirian Mo MD PGY-2, [place] [facility] 05/06/2019 9:07 PM  FPTS Service pager: [phone] (text pages welcome through [facility])

## 2019-05-07 LAB — CBC
HCT: 38.8 % — ABNORMAL LOW (ref 39.0–52.0)
Hemoglobin: 13.5 g/dL (ref 13.0–17.0)
MCH: 31.1 pg (ref 26.0–34.0)
MCHC: 34.8 g/dL (ref 30.0–36.0)
MCV: 89.4 fL (ref 80.0–100.0)
Platelets: 153 10*3/uL (ref 150–400)
RBC: 4.34 MIL/uL (ref 4.22–5.81)
RDW: 12 % (ref 11.5–15.5)
WBC: 10.3 10*3/uL (ref 4.0–10.5)
nRBC: 0 % (ref 0.0–0.2)

## 2019-05-07 LAB — BASIC METABOLIC PANEL
Anion gap: 8 (ref 5–15)
BUN: 7 mg/dL (ref 6–20)
CO2: 24 mmol/L (ref 22–32)
Calcium: 8.2 mg/dL — ABNORMAL LOW (ref 8.9–10.3)
Chloride: 105 mmol/L (ref 98–111)
Creatinine, Ser: 0.81 mg/dL (ref 0.61–1.24)
GFR calc Af Amer: >60 mL/min (ref >60)
GFR calc non Af Amer: >60 mL/min (ref >60)
Glucose, Bld: 119 mg/dL — ABNORMAL HIGH (ref 70–99)
Potassium: 3.5 mmol/L (ref 3.5–5.1)
Sodium: 137 mmol/L (ref 135–145)

## 2019-05-07 LAB — HIV ANTIBODY (ROUTINE TESTING W REFLEX): HIV Screen 4th Generation wRfx: NONREACTIVE

## 2019-05-07 MED ORDER — MUPIROCIN 2 % EX OINT
TOPICAL_OINTMENT | CUTANEOUS | Status: AC
  Filled 2019-05-07: qty 22

## 2019-05-07 MED ORDER — SULFAMETHOXAZOLE-TRIMETHOPRIM 800-160 MG PO TABS
1.0000 | ORAL_TABLET | Freq: Two times a day (BID) | ORAL | Status: DC
  Administered 2019-05-07 – 2019-05-08 (×2): 1 via ORAL
  Filled 2019-05-07 (×2): qty 1

## 2019-05-07 NOTE — Progress Notes (Signed)
Family Medicine Teaching Service Daily Progress Note Intern Pager: [phone]  Patient name: Devin Graham. Medical record number: [id number] Date of birth: [birth year] Age: 28 y.o. Gender: male  Primary Care Provider: Patient, No Pcp Per Consultants: ENT Code Status: Full  Pt Overview and Major Events to Date:  10/07-admitted for left facial swelling  Assessment and Plan: Babe Clenney. is a 28 y.o. male presenting with facial swelling. PMH is significant for Adenoidectomy  Sepsis secondary to Lt Facial Cellulitis  Pt afebrile overnight. Improved WBC to 10.3 from 15.6 On exam swelling improved back to 20%.  Redness has slightly decreased.  Able to open mouth more than on admission.  ENT, agrees with current management. Blood cultures sent 10/07 -Continue Vancomycin IV for 1 more dose then transition to Bactrim DS twice daily -f/u blood cultures -Ibuprofen 600mg  q6h pr n -Oxycodone IR 5mg  q4h  -Monitor vitals every 4 hours  Chest pain likely secondary to anxiety resolved Vital signs stable. Did not require antianxilytic overnight. On exam patient not verbalizing any chest pain. -Follow-up EKG -Atarax as needed  Nicotine use disorder -Nicotine patch 21 mg daily  History of GERD -Continue to monitor  FEN/GI:  -Regular diet  Prophylaxis:  -Lovenox   Disposition: Plan for early discharge 10/08  Subjective:  Patient sitting in bed finishing breakfast.  Uneventful night.  Feeling somewhat better.  Facial swelling improving.  Denies chest pain, shortness of breath, difficulty swallowing.  Eating and drinking well.  Voiding well.  Objective: Temp:  [97.6 F (36.4 C)-99.8 F (37.7 C)] 97.8 F (36.6 C) (10/08 0452) Pulse Rate:  [65-109] 74 (10/08 0452) Resp:  [12-25] 14 (10/08 0452) BP: (108-146)/(63-93) 121/85 (10/08 0452) SpO2:  [93 %-100 %] 100 % (10/08 0452) Weight:  [81.6 kg] 81.6 kg (10/07 1822) Physical Exam: General: Awake and in no apparent  distress Head: Left-sided facial edema improving, no crepitus, erythema has improved slightly, tenderness to palpation. Cardiovascular: Regular rate and rhythm, no murmurs appreciated, no rubs or gallops, distal pulses present Respiratory: Clear to auscultation bilaterally, no crackles, no rhonchi, good cap refill Abdomen: Soft, nontender, nondistended, bowel sounds present Extremities: Moving all extremities, no lower extremity edema  Laboratory: Recent Labs  Lab 05/06/19 0753 05/07/19 0441  WBC 15.6* 10.3  HGB 16.2 13.5  HCT 47.0 38.8*  PLT 234 153   Recent Labs  Lab 05/06/19 0753 05/07/19 0441  NA 135 137  K 3.6 3.5  CL 100 105  CO2 22 24  BUN 8 7  CREATININE 0.92 0.81  CALCIUM 9.3 8.2*  PROT 8.1  --   BILITOT 2.9*  --   ALKPHOS 100  --   ALT 21  --   AST 18  --   GLUCOSE 121* 119*     Imaging/Diagnostic Tests:   Carollee Leitz, MD 05/07/2019, 6:13 AM PGY 1, [place] Intern pager: [phone], text pages welcome

## 2019-05-07 NOTE — Plan of Care (Signed)

## 2019-05-07 NOTE — Progress Notes (Signed)
FPTS Interim Progress Note  PM check on patient with Dr. Rosita Fire. Patient states he is doing well.  States that he has having no difficulty breathing or swallowing.  Was able to swallow some saliva while we were in the room without difficulty.  States that his feet still feel swollen but otherwise no pain.  Resting comfortably.  O: BP 108/68 (BP Location: Left Arm)   Pulse 65   Temp 97.6 F (36.4 C) (Oral)   Resp 16   Ht 5\' 9"  (1.753 m)   Wt 81.6 kg   SpO2 100%   BMI 26.58 kg/m   HEENT: no tonsillar enlargement, uvula midline, tongue midline without edema, airway patent, moist mucous membranes, edema to L side of face, PEERL, EOMI without pain   A/P: Left facial cellulitis -continue broad spectrum abx   Vancomycin (10/7-)  Zosyn (10/7-)  S/p clinda (10/7) -continuous pulse ox  -If airway compromise will consult CCM STAT -vitals q4h  Caroline More, DO 05/07/2019, 12:51 AM PGY-3, [place] Medicine Service pager [phone]

## 2019-05-07 NOTE — Progress Notes (Signed)
The patient is doing well today. Denies any pain. Facial swelling improved. No new concern today.  We will plan on switching to oral A/B today and likely d/c home tomorrow.   I will cosign the resident's note once it is completed today.

## 2019-05-08 ENCOUNTER — Encounter (HOSPITAL_COMMUNITY): Payer: Self-pay | Admitting: General Practice

## 2019-05-08 MED ORDER — OXYCODONE HCL 5 MG PO TABS
5.0000 mg | ORAL_TABLET | Freq: Four times a day (QID) | ORAL | Status: AC | PRN
  Administered 2019-05-08: 5 mg via ORAL
  Filled 2019-05-08 (×2): qty 1

## 2019-05-08 MED ORDER — VANCOMYCIN HCL IN DEXTROSE 1-5 GM/200ML-% IV SOLN
1000.0000 mg | Freq: Three times a day (TID) | INTRAVENOUS | Status: DC
  Administered 2019-05-08 – 2019-05-09 (×3): 1000 mg via INTRAVENOUS
  Filled 2019-05-08 (×3): qty 200

## 2019-05-08 MED ORDER — VANCOMYCIN HCL 10 G IV SOLR
1500.0000 mg | Freq: Once | INTRAVENOUS | Status: AC
  Administered 2019-05-08: 1500 mg via INTRAVENOUS
  Filled 2019-05-08: qty 1500

## 2019-05-08 NOTE — Progress Notes (Addendum)
Family Medicine Teaching Service Daily Progress Note Intern Pager: [phone]  Patient name: Devin Graham. Medical record number: [id number] Date of birth: 14-Feb-1991 Age: 28 y.o. Gender: male  Primary Care Provider: Patient, No Pcp Per Consultants: ENT Code Status: Full  Pt Overview and Major Events to Date:  10/07-admitted for left facial swelling  Assessment and Plan: [name]. is a 28 y.o. male presenting with facial swelling. PMH is significant for Adenoidectomy  Sepsis secondary to Lt Facial Cellulitis  Pt afebrile overnight. Improved WBC to 10.3 from 15.6 On exam swelling increased overnight.  Now having left periorbital edema.  More induration noted around open area.  Having increasing difficulty opening mouth.  Denies any throat pain, drooling, shortness of breath. -Discontinue Bactrim -Start vancomycin IV per pharmacy protocol. - blood cultures no growth after 2 days. -Ibuprofen 600mg  q6h pr n -Oxycodone IR 5mg  q4h x24-hour -Monitor vitals every 4 hours  Chest pain secondary to anxiety Vital signs stable. Did not require antianxilytic overnight. On exam patient tearful.  Reports some chest pain.  On further evaluation pain is mostly located in neck.  Patient voices concern of increasing neck swelling. -EKG 10/8 no acute events -Atarax as needed  Nicotine use disorder -Nicotine patch 21 mg daily  History of GERD -Continue to monitor  FEN/GI:  -Regular diet  Prophylaxis:  -Lovenox   Disposition: Discharge home when medically stable  Subjective:  Patient very teary-eyed this morning.  Is having more pain in affected area.  Voiced concerns of increased swelling to his lower neck as well as to his left thigh.  Denies any shortness of breath, airway disturbances.  Is having some neck pain.   Objective: Temp:  [97.8 F (36.6 C)-98.5 F (36.9 C)] 97.8 F (36.6 C) (10/09 1432) Pulse Rate:  [70-96] 82 (10/09 1432) Resp:  [16-18] 17 (10/09  1432) BP: (110-130)/(69-82) 110/69 (10/09 1432) SpO2:  [98 %-100 %] 99 % (10/09 1432) Physical Exam: General: Awake, tearful, and in pain Head: Left-sided facial edema increased to include left periorbital edema as well as dependent edema of the neck, no crepitus, erythema has improved slightly, tenderness to palpation, increasing induration around the area. Cardiovascular: Regular rate and rhythm, no murmurs appreciated, no gallops, distal pulses present. Respiratory: Clear to auscultation, no crackles, no rhonchi Abdomen: Soft, nontender, nondistended, bowel sounds present Extremities: Moving all extremities, no lower extremity edema  Laboratory: Recent Labs  Lab 05/06/19 0753 05/07/19 0441  WBC 15.6* 10.3  HGB 16.2 13.5  HCT 47.0 38.8*  PLT 234 153   Recent Labs  Lab 05/06/19 0753 05/07/19 0441  NA 135 137  K 3.6 3.5  CL 100 105  CO2 22 24  BUN 8 7  CREATININE 0.92 0.81  CALCIUM 9.3 8.2*  PROT 8.1  --   BILITOT 2.9*  --   ALKPHOS 100  --   ALT 21  --   AST 18  --   GLUCOSE 121* 119*     Imaging/Diagnostic Tests:   Carollee Leitz, MD 05/08/2019, 3:45 PM PGY 1, [place] Intern pager: [phone], text pages welcome

## 2019-05-08 NOTE — Progress Notes (Signed)
Pharmacy Antibiotic Note  Devin Lad. is a 28 y.o. male admitted on 05/06/2019 with facial cellulitis.  Pharmacy has been consulted for vancomycin  dosing.  Patient received ~24 hours of vancomycin therapy and then transitioned to PO abx. Now will transition back to vancomycin.   Plan: Vancomycin 1500mg  IV x1, then 1000mg  IV every 8 hours Goal AUC 400-550. Expected AUC: 497 SCr used: 0.92 Monitor renal function, clinical progression and LOT Vancomycin levels as needed  Height: 5\' 9"  (175.3 cm) Weight: 180 lb (81.6 kg) IBW/kg (Calculated) : 70.7  Temp (24hrs), Avg:98.4 F (36.9 C), Min:98.2 F (36.8 C), Max:98.5 F (36.9 C)  Recent Labs  Lab 05/06/19 0753 05/06/19 0933 05/07/19 0441  WBC 15.6*  --  10.3  CREATININE 0.92  --  0.81  LATICACIDVEN 1.4 1.1  --     Estimated Creatinine Clearance: 134.6 mL/min (by C-G formula based on SCr of 0.81 mg/dL).    No Known Allergies  Vondell Sowell A. Levada Dy, PharmD, BCPS, FNKF Clinical Pharmacist [place] Please utilize Amion for appropriate phone number to reach the unit pharmacist ([place])   05/08/2019 12:09 PM

## 2019-05-08 NOTE — Plan of Care (Signed)

## 2019-05-08 NOTE — Plan of Care (Signed)
°  Problem: Coping: °Goal: Level of anxiety will decrease °Outcome: Progressing °  °

## 2019-05-08 NOTE — Discharge Summary (Signed)
Family Medicine Teaching Service [facility] Discharge Summary  Patient name: Devin Graham. Medical record number: [id number] Date of birth: 1991/04/24 Age: 28 y.o. Gender: male Date of Admission: 05/06/2019  Date of Discharge: 05/10/2019 Admitting Physician: Nestor Ramp, MD  Primary Care Provider: Patient, No Pcp Per Consultants: ENT  Indication for Hospitalization: severe Left facial cellulitis  Discharge Diagnoses/Problem List:  Facial cellulitis Anxiety Tobacco use THC use  Disposition: Home  Discharge Condition: Stable  Discharge Exam:  General: Pleasant 28 year old male in no acute distress  Facial cellulitis: Area of erythema with induration surroundin wound, small amount purulent drainage noted, facial edema has improved, continues to have some periorbital edema, no impaired vision, EOMs intact Cardiovascular: Regular rate and rhythm, no murmurs appreciated, distal pulses present Respiratory: Clear to auscultation bilateral, no rhonchi, no crackles, good cap refill  Gastrointestinal: Soft, nontender, nondistended, bowel sounds present.   MSK: Moves all extremities with 5/5 strength    Neuro: Alert and orientated x4 Psych: Pleasant and cooperative  Brief Hospital Course:  Patient presents the ED with significant left sided facial edema from a popped pimple 2 days ago.  Vital signs are significant for tachycardia with heart rate of 106.  Afebrile, blood pressure within normal limits.  Labs were significant for white WBC 15.6 with neutrophilic shift.  CT neck shows extensive soft tissue swelling of left face predominantly in the subcutaneous tissue with edema extending down to the platysmas muscle, no soft tissue abscess.  Was given a dose of clindamycin IV in the ED but was switched to IV vancomycin and IV Zosyn as it appeared not to be responding to Clinda.  Transitioned to p.o. Bactrim next day by patient continued to have facial swelling.  Restarted IV vancomycin and then  transition to doxycycline 100 mg twice daily with improving facial swelling.  Wound cultures pending.  On the day of discharge patient was stable with significant improvement.  Issues for Follow Up:  1. Monitor for increasing cellulitis or abscess formation. Follow-up wound cultures.  2. Discussed anxiety and ways to decrease stress  Significant Procedures: None  Significant Labs and Imaging:  Recent Labs  Lab 05/06/19 0753 05/07/19 0441 05/09/19 0414  WBC 15.6* 10.3 8.7  HGB 16.2 13.5 13.5  HCT 47.0 38.8* 39.0  PLT 234 153 205   Recent Labs  Lab 05/06/19 0753 05/07/19 0441 05/09/19 0414  NA 135 137 138  K 3.6 3.5 3.9  CL 100 105 102  CO2 22 24 24   GLUCOSE 121* 119* 94  BUN 8 7 7   CREATININE 0.92 0.81 0.76  CALCIUM 9.3 8.2* 8.7*  ALKPHOS 100  --   --   AST 18  --   --   ALT 21  --   --   ALBUMIN 4.5  --   --       Results/Tests Pending at Time of Discharge:   Discharge Medications:  Allergies as of 05/10/2019   No Known Allergies     Medication List    TAKE these medications   doxycycline 100 MG tablet Commonly known as: VIBRA-TABS Take 1 tablet (100 mg total) by mouth every 12 (twelve) hours.   ibuprofen 600 MG tablet Commonly known as: ADVIL Take 1 tablet (600 mg total) by mouth every 6 (six) hours as needed for moderate pain.       Discharge Instructions: Please refer to Patient Instructions section of EMR for full details.  Patient was counseled important signs and symptoms that should prompt  return to medical care, changes in medications, dietary instructions, activity restrictions, and follow up appointments.   Follow-Up Appointments: Follow-up Information    [place] Go on 06/04/2019.   Why: at 2:30pm Contact information: [place] [address] [phone]       Carollee Leitz, MD Follow up.   Specialty: Family Medicine Contact information: [address] [address] [phone]           Carollee Leitz, MD 05/11/2019, 5:20 PM PGY-1, [place]

## 2019-05-08 NOTE — Progress Notes (Signed)
Pt having increased pain and anxiety. Provider notified. Provider has added oxycodone, was d/c previously. Anti-anxiety medication given. Pt is resting in bed.

## 2019-05-09 LAB — BASIC METABOLIC PANEL
Anion gap: 12 (ref 5–15)
BUN: 7 mg/dL (ref 6–20)
CO2: 24 mmol/L (ref 22–32)
Calcium: 8.7 mg/dL — ABNORMAL LOW (ref 8.9–10.3)
Chloride: 102 mmol/L (ref 98–111)
Creatinine, Ser: 0.76 mg/dL (ref 0.61–1.24)
GFR calc Af Amer: >60 mL/min (ref >60)
GFR calc non Af Amer: >60 mL/min (ref >60)
Glucose, Bld: 94 mg/dL (ref 70–99)
Potassium: 3.9 mmol/L (ref 3.5–5.1)
Sodium: 138 mmol/L (ref 135–145)

## 2019-05-09 LAB — CBC
HCT: 39 % (ref 39.0–52.0)
Hemoglobin: 13.5 g/dL (ref 13.0–17.0)
MCH: 31.2 pg (ref 26.0–34.0)
MCHC: 34.6 g/dL (ref 30.0–36.0)
MCV: 90.1 fL (ref 80.0–100.0)
Platelets: 205 10*3/uL (ref 150–400)
RBC: 4.33 MIL/uL (ref 4.22–5.81)
RDW: 11.9 % (ref 11.5–15.5)
WBC: 8.7 10*3/uL (ref 4.0–10.5)
nRBC: 0 % (ref 0.0–0.2)

## 2019-05-09 MED ORDER — DOXYCYCLINE HYCLATE 100 MG PO TABS
100.0000 mg | ORAL_TABLET | Freq: Two times a day (BID) | ORAL | Status: DC
  Administered 2019-05-09 – 2019-05-10 (×2): 100 mg via ORAL
  Filled 2019-05-09 (×2): qty 1

## 2019-05-09 NOTE — Progress Notes (Addendum)
Family Medicine Teaching Service Daily Progress Note Intern Pager: [phone]  Patient name: Devin Graham. Medical record number: [id number] Date of birth: Feb 21, 1991 Age: 27 y.o. Gender: male  Primary Care Provider: Patient, No Pcp Per Consultants: ENT Code Status: Full Code  Pt Overview and Major Events to Date:  10/07-admitted for left facial swelling Zocysn 10/7-10/8 10/09- patient transitioned to [facility] for total of 4 days during this admission   Assessment and Plan: Devin Grahamis a 28 y.o.malepresenting with facial swelling c/w cellulitis. PMH is significant forAdenoidectomy.   Sepsis secondary to LtFacial Cellulitis Pt afebrile overnight. Improved WBC to 8.7 from 10.3. On exam, swelling appears to have improved from my initial exam soon after patient's admission, continued purulent fluid is drainage, induration does not appear to have worsened, patient continues to have periorbital swelling.  Patient reports Patient has continued left periorbital edema.  Stable induration noted around wound, with no warmth to touch.    Blood cultures have been negative since admission.   Aerobic wound gram stain with rare gram positive cocci, predominant PMN, culture still pending. Will await results of cultures in order to guide antibiotic de-escalation.  -continue IV Vanc -Ibuprofen 600mg  q6h pr n -Oxycodone IR 5mg  q4h x24-hour -Monitor vitals every 4 hours  Hx of anxiety Vital signs stable overnight.  On exam, patient is not anxious appearing. Patient required some doses of atarax throughout the night.  -cultures  -Atarax as needed  Nicotine use disorder -Nicotine patch 21 mg daily  History of GERD -Continue to monitor  FEN/GI: Regular diet  Prophylaxis:Lovenox   Disposition: Discharge home when medically stable  Subjective:  Patient states that he feels minimal pain 2/2 to his facial cellulitis and is asking if there is anything that can be done to help  with the fluid collecting in his neck. Patient denies any dysphagia, SOB or dyspnea.  Objective: Temp:  [97.8 F (36.6 C)-98.5 F (36.9 C)] 98.4 F (36.9 C) (10/10 0723) Pulse Rate:  [73-98] 73 (10/10 0723) Resp:  [16-18] 16 (10/10 0723) BP: (110-127)/(69-82) 123/70 (10/10 0723) SpO2:  [97 %-99 %] 97 % (10/10 0723)  Physical Exam: General: slightly uncomfortable appearing male lying in bed in NAD  Cardiovascular: RRR without murmurs, gallops or rubs  Respiratory: CTAB without wheezing or increased WOB  Abdomen: soft, NT Facial Cellulitis: concentrated erythema with induration surrounding draining wound with purulent fluid, swelling appears to have improved, continued purulent fluid is drainaging, induration does not appear to have worsened, patient continues to have periorbital swelling with no impaired vision or limited ROM of EOM   Media Information    Document Information  Photos    05/09/2019 06:11  Attached To:  Hospital Encounter on 05/06/19  Source Information  Stark Klein, MD  [facility]    Laboratory: Recent Labs  Lab 05/06/19 0753 05/07/19 0441 05/09/19 0414  WBC 15.6* 10.3 8.7  HGB 16.2 13.5 13.5  HCT 47.0 38.8* 39.0  PLT 234 153 205   Recent Labs  Lab 05/06/19 0753 05/07/19 0441 05/09/19 0414  NA 135 137 138  K 3.6 3.5 3.9  CL 100 105 102  CO2 22 24 24   BUN 8 7 7   CREATININE 0.92 0.81 0.76  CALCIUM 9.3 8.2* 8.7*  PROT 8.1  --   --   BILITOT 2.9*  --   --   ALKPHOS 100  --   --   ALT 21  --   --   AST 18  --   --  GLUCOSE 121* 119* 94   Imaging/Diagnostic Tests: No new imaging   [name], MD 05/09/2019, 8:11 AM PGY-1, [facility] Family Medicine FPTS Intern pager: [phone], text pages welcome

## 2019-05-09 NOTE — Progress Notes (Signed)
NT, Asencion Partridge, walked out of pt's room after rounding on pt and notified myself as well as charge RN, Roj, of pt's room smelling like marijuana. Charge nurse recommended calling security to come to pt's room and talk to pt. Security was called and pt denied using any drugs in room. Pt's partner was also in room and denied using any drugs as well. Earlier in the day when pt's partner came up to visit pt, partner asked myself and charge RN at the front desk if it was legal to smoke marijuana in room. Agricultural consultant and myself told pt's partner it is illegal and no drugs are allowed in the hospital. Security left after talking to pt in room and report was given to night shift nurse, Daune Perch.

## 2019-05-09 NOTE — Plan of Care (Signed)
°  Problem: Coping: °Goal: Level of anxiety will decrease °Outcome: Progressing °  °

## 2019-05-10 MED ORDER — IBUPROFEN 600 MG PO TABS: 600.0000 mg | ORAL_TABLET | Freq: Four times a day (QID) | ORAL | 0 refills | Status: AC | PRN

## 2019-05-10 MED ORDER — DOXYCYCLINE HYCLATE 100 MG PO TABS: 100.0000 mg | ORAL_TABLET | Freq: Two times a day (BID) | ORAL | 0 refills | Status: DC

## 2019-05-10 NOTE — Progress Notes (Signed)
Chart reviewed and pt has appt with [place]. [facility]

## 2019-05-10 NOTE — Plan of Care (Signed)

## 2019-05-10 NOTE — Discharge Instructions (Signed)
Take Doxycycline 1 tablet 2 times until finished 10/18 Warm compresses as needed. You can take Tylenol 500 mg every 6 hours as needed or Ibuprofen 200 mg every 8 hours as needed for pain.  Avoid squeezing pimples especially the facial area to decrease chance of similar episodes happening.  If you notice that the swelling is increasing please seek medical attention.  Follow up with PCP 10/16 at 315pm

## 2019-05-10 NOTE — Progress Notes (Signed)
Discharge instructions addressed; Pt is stable condition; not in respiratory distress;Pt to be picked up by a family member at the [employer] entrance.

## 2019-05-10 NOTE — Progress Notes (Signed)
   eCoupon Get a card by mail Show this free coupon to your pharmacist Discount Drug Coupon Your prescription doxycycline hyclate 100mg  15 capsules Discounted price with [facility] coupon [facility] This is your estimated price. The pharmacy will provide exact pricing. Pharmacist info Member ID [id number] Group Anner Crete [id number] PCN [id number] Customer questions call: [phone] Pharmacist questions call: [phone] Frequently Asked Questions What are GoodRx coupons?GoodRx coupons will help you pay less than the retail price for your prescription. They're free to use and are accepted at participating pharmacies in all states and [place]. How do I use a GoodRx coupon?It's similar to using a coupon at a grocery store. Just show it to the pharmacist when you pick up your prescription. The pharmacist will enter the numbers on the coupon into their system for the discount. What if my pharmacy cannot process this coupon?This coupon is valid at participating pharmacies in all states and [place]. If your pharmacy is in the network but unable or unwilling to process your discount, please have the pharmacist call the number on the coupon. Can I use this coupon if I have health insurance?The price on the GoodRx coupon may be lower than your health insurance co-pay, but the coupon cannot lower your co-pay. Ask your pharmacist to help you find the best possible price. Remember. this coupon: Will work at [employer] (price may vary) Can be used for all of your family's prescriptions Has no fees or obligations - it's free to use For the pharmacist This coupon displays a contracted rate based on agreements between your pharmacy or purchasing group and a Chief Technology Officer (PBM).  Please use the above BIN, PCN and Group number to adjudicate.  Questions? Please call [phone].  This coupon was printed on 05/10/19. Please note that all prices are subject to change  over time. Sales tax may apply, tax may vary by state and is not included in the displayed coupon price. For up-to-date prices and coupons check www.goodrx.com or download the [employer] or [employer].  Questions? We're here to help - visit [place] at support.goodrx.com  THIS IS NOT INSURANCE. THE PRICE ON THE GOODRX COUPON MAY BE LOWER THAN YOUR HEALTH INSURANCE CO-PAY, BUT IT CANNOT BE USED TO LOWER YOUR CO-PAY. ASK YOUR PHARMACIST TO HELP YOU FIND THE BEST POSSIBLE PRICE.THIS COUPON CANNOT BE USED IN CONJUNCTION WITH MEDICARE, MEDICAID, OR ANY GOVERNMENT-FUNDED PROGRAM.  Cookie Preferences Collection Notice

## 2019-05-10 NOTE — Progress Notes (Addendum)
Dramatic left facial cellulitis improvement on Doxycycline. We can safely d/c him home today on Doxy. I will cosign the resident's note once it is completed.

## 2019-05-11 ENCOUNTER — Telehealth: Payer: Self-pay | Admitting: *Deleted

## 2019-05-11 ENCOUNTER — Telehealth: Payer: Self-pay | Admitting: Family Medicine

## 2019-05-11 LAB — AEROBIC CULTURE W GRAM STAIN (SUPERFICIAL SPECIMEN)

## 2019-05-11 LAB — CULTURE, BLOOD (ROUTINE X 2)
Culture: NO GROWTH
Culture: NO GROWTH
Special Requests: ADEQUATE

## 2019-05-11 NOTE — Telephone Encounter (Signed)
TOC CM spoke to pt and aware of appt on 05/15/2019 at [facility]. States he plans to keep his appt on Thursday. Pt will pick up medication today after work. Received goodrx coupon for $20 and can afford medication. Wants CM to cancel [facility] appt on 11/5. [place], [place] ED TOC CM [phone]

## 2019-05-11 NOTE — Telephone Encounter (Signed)
[facility] and spoke to pharmacist.

## 2019-05-11 NOTE — Telephone Encounter (Signed)
HIPAA compliant callback message left.   Note that his wound culture report shows MRSA. He had a goood response to Doxycycline and it appears that his MIC is tetracycline sensitive. F/U with PCP as planned.

## 2019-05-15 ENCOUNTER — Other Ambulatory Visit: Payer: Self-pay

## 2019-05-15 ENCOUNTER — Encounter: Payer: Self-pay | Admitting: Family Medicine

## 2019-05-15 ENCOUNTER — Ambulatory Visit (INDEPENDENT_AMBULATORY_CARE_PROVIDER_SITE_OTHER): Payer: Self-pay | Admitting: Family Medicine

## 2019-05-15 VITALS — BP 112/60 | HR 97 | Ht 70.0 in | Wt 214.4 lb

## 2019-05-15 DIAGNOSIS — L03211 Cellulitis of face: Secondary | ICD-10-CM

## 2019-05-15 MED ORDER — DOXYCYCLINE HYCLATE 100 MG PO TABS: 100.0000 mg | ORAL_TABLET | Freq: Two times a day (BID) | ORAL | 0 refills | Status: AC

## 2019-05-15 NOTE — Progress Notes (Signed)
  Patient Name: Devin Graham. Date of Birth: Nov 11, 1990 Date of Visit: 05/15/19 PCP: Patient, No Pcp Per  Chief Complaint:   Subjective: Devin Graham. is a pleasant 28 y.o. with no significant past medical history is presenting today for follow up hospital admission for facial cellulitis.   Left Facial Cellulitis Follow-up from recent hospital admission, 10/7-10/11.  Patient doing well.  Denies any headaches, visual disturbances or fever.  Able to eat and drink.  Denies any pain.  Swelling has significantly subsided.  Was unable to continue doxycycline post discharge hospital as medication was too expensive.  Patient has been back to work doing regular activities.  ROS: Per HPI.   I have reviewed the patient's medical, surgical, family, and social history as appropriate.  Vitals:   05/15/19 1535  BP: 112/60  Pulse: 97  SpO2: 97%    General: 28 year old male in no acute distress Face: Left-sided facial swelling significantly decreased.  Slight erythema noted.  Still some induration around the affected area.  Nontender to palpation.  Scab formed and no drainage noted.  Mucous membranes moist, buccal area edema significantly improved.   Cellulitis Left facial cellulitis improving.  Wound culture report positive for rare MRSA.  Sensitive to doxycycline. We will continue doxycycline 100 mg twice daily x5 days. [facility]cription sent to [facility], coupon given to patient. Follow-up as needed.   Return to care as needed  Carollee Leitz, MD  [facility] Medicine Teaching Service

## 2019-05-15 NOTE — Assessment & Plan Note (Signed)
Left facial cellulitis improving.  Wound culture report positive for rare MRSA.  Sensitive to doxycycline. We will continue doxycycline 100 mg twice daily x5 days. [facility]cription sent to [facility], coupon given to patient. Follow-up as needed.

## 2019-05-15 NOTE — Patient Instructions (Addendum)
It was a pleasure seeing you today.  Patient is improved a lot since admission to hospital. Because there is still a little bit of redness and hardness around that area I will prescribe doxycycline 100 mg 2 times a day for 5 days.  Follow-up in clinic as needed. At your next visit you will fill out some papers to have all of your medical information here at the clinic.  Carollee Leitz MD

## 2019-06-04 ENCOUNTER — Inpatient Hospital Stay: Payer: Self-pay | Admitting: Family Medicine

## 2020-02-02 ENCOUNTER — Emergency Department (HOSPITAL_COMMUNITY)
Admission: EM | Admit: 2020-02-02 | Discharge: 2020-02-02 | Disposition: A | Discharge status: Home / Self Care | Payer: Self-pay | Attending: Emergency Medicine | Admitting: Emergency Medicine

## 2020-02-02 ENCOUNTER — Other Ambulatory Visit: Payer: Self-pay

## 2020-02-02 ENCOUNTER — Encounter (HOSPITAL_COMMUNITY): Payer: Self-pay | Admitting: Emergency Medicine

## 2020-02-02 DIAGNOSIS — R112 Nausea with vomiting, unspecified: Secondary | ICD-10-CM | POA: Insufficient documentation

## 2020-02-02 DIAGNOSIS — R1032 Left lower quadrant pain: Secondary | ICD-10-CM | POA: Insufficient documentation

## 2020-02-02 DIAGNOSIS — Z5321 Procedure and treatment not carried out due to patient leaving prior to being seen by health care provider: Secondary | ICD-10-CM | POA: Insufficient documentation

## 2020-02-02 LAB — URINALYSIS, ROUTINE W REFLEX MICROSCOPIC
Bilirubin Urine: NEGATIVE
Glucose, UA: NEGATIVE mg/dL
Hgb urine dipstick: NEGATIVE
Ketones, ur: 20 mg/dL — AB
Leukocytes,Ua: NEGATIVE
Nitrite: NEGATIVE
Protein, ur: 30 mg/dL — AB
Specific Gravity, Urine: 1.029 (ref 1.005–1.030)
pH: 7 (ref 5.0–8.0)

## 2020-02-02 LAB — COMPREHENSIVE METABOLIC PANEL
ALT: 18 U/L (ref 0–44)
AST: 14 U/L — ABNORMAL LOW (ref 15–41)
Albumin: 4.2 g/dL (ref 3.5–5.0)
Alkaline Phosphatase: 90 U/L (ref 38–126)
Anion gap: 13 (ref 5–15)
BUN: 11 mg/dL (ref 6–20)
CO2: 23 mmol/L (ref 22–32)
Calcium: 9.4 mg/dL (ref 8.9–10.3)
Chloride: 101 mmol/L (ref 98–111)
Creatinine, Ser: 1.09 mg/dL (ref 0.61–1.24)
GFR calc Af Amer: >60 mL/min (ref >60)
GFR calc non Af Amer: >60 mL/min (ref >60)
Glucose, Bld: 97 mg/dL (ref 70–99)
Potassium: 3.9 mmol/L (ref 3.5–5.1)
Sodium: 137 mmol/L (ref 135–145)
Total Bilirubin: 1.8 mg/dL — ABNORMAL HIGH (ref 0.3–1.2)
Total Protein: 7.2 g/dL (ref 6.5–8.1)

## 2020-02-02 LAB — CBC
HCT: 52.3 % — ABNORMAL HIGH (ref 39.0–52.0)
Hemoglobin: 18.1 g/dL — ABNORMAL HIGH (ref 13.0–17.0)
MCH: 30.9 pg (ref 26.0–34.0)
MCHC: 34.6 g/dL (ref 30.0–36.0)
MCV: 89.4 fL (ref 80.0–100.0)
Platelets: 272 10*3/uL (ref 150–400)
RBC: 5.85 MIL/uL — ABNORMAL HIGH (ref 4.22–5.81)
RDW: 12.1 % (ref 11.5–15.5)
WBC: 12.6 10*3/uL — ABNORMAL HIGH (ref 4.0–10.5)
nRBC: 0 % (ref 0.0–0.2)

## 2020-02-02 LAB — LIPASE, BLOOD: Lipase: 25 U/L (ref 11–51)

## 2020-02-02 MED ORDER — SODIUM CHLORIDE 0.9% FLUSH: 3.0000 mL | Freq: Once | INTRAVENOUS | Status: DC

## 2020-02-02 NOTE — ED Triage Notes (Signed)
Pt c/o LLQ pain with nausea and vomiting that started last night.

## 2020-02-02 NOTE — ED Notes (Signed)
Pt yelling and cussing at staff and security. [employer] present. This RN asked pt if he was staying to be seen, pt refused.

## 2021-04-12 ENCOUNTER — Emergency Department: Payer: Self-pay

## 2021-04-12 ENCOUNTER — Emergency Department
Admission: EM | Admit: 2021-04-12 | Discharge: 2021-04-12 | Disposition: A | Discharge status: Home / Self Care | Payer: Self-pay | Attending: Emergency Medicine | Admitting: Emergency Medicine

## 2021-04-12 ENCOUNTER — Other Ambulatory Visit: Payer: Self-pay

## 2021-04-12 ENCOUNTER — Encounter: Payer: Self-pay | Admitting: Emergency Medicine

## 2021-04-12 DIAGNOSIS — K2900 Acute gastritis without bleeding: Secondary | ICD-10-CM | POA: Insufficient documentation

## 2021-04-12 DIAGNOSIS — F17210 Nicotine dependence, cigarettes, uncomplicated: Secondary | ICD-10-CM | POA: Insufficient documentation

## 2021-04-12 LAB — COMPREHENSIVE METABOLIC PANEL
ALT: 15 U/L (ref 0–44)
AST: 13 U/L — ABNORMAL LOW (ref 15–41)
Albumin: 4.1 g/dL (ref 3.5–5.0)
Alkaline Phosphatase: 87 U/L (ref 38–126)
Anion gap: 8 (ref 5–15)
BUN: 10 mg/dL (ref 6–20)
CO2: 30 mmol/L (ref 22–32)
Calcium: 9.7 mg/dL (ref 8.9–10.3)
Chloride: 100 mmol/L (ref 98–111)
Creatinine, Ser: 1.01 mg/dL (ref 0.61–1.24)
GFR, Estimated: >60 mL/min (ref >60)
Glucose, Bld: 101 mg/dL — ABNORMAL HIGH (ref 70–99)
Potassium: 3.7 mmol/L (ref 3.5–5.1)
Sodium: 138 mmol/L (ref 135–145)
Total Bilirubin: 1.8 mg/dL — ABNORMAL HIGH (ref 0.3–1.2)
Total Protein: 6.7 g/dL (ref 6.5–8.1)

## 2021-04-12 LAB — CBC
HCT: 47.3 % (ref 39.0–52.0)
Hemoglobin: 17 g/dL (ref 13.0–17.0)
MCH: 32.5 pg (ref 26.0–34.0)
MCHC: 35.9 g/dL (ref 30.0–36.0)
MCV: 90.4 fL (ref 80.0–100.0)
Platelets: 256 10*3/uL (ref 150–400)
RBC: 5.23 MIL/uL (ref 4.22–5.81)
RDW: 12 % (ref 11.5–15.5)
WBC: 8.7 10*3/uL (ref 4.0–10.5)
nRBC: 0 % (ref 0.0–0.2)

## 2021-04-12 LAB — LIPASE, BLOOD: Lipase: 34 U/L (ref 11–51)

## 2021-04-12 MED ORDER — IOHEXOL 350 MG/ML SOLN
100.0000 mL | Freq: Once | INTRAVENOUS | Status: AC | PRN
  Administered 2021-04-12: 100 mL via INTRAVENOUS

## 2021-04-12 MED ORDER — ONDANSETRON HCL 4 MG/2ML IJ SOLN
4.0000 mg | Freq: Once | INTRAMUSCULAR | Status: AC
  Administered 2021-04-12: 4 mg via INTRAVENOUS
  Filled 2021-04-12: qty 2

## 2021-04-12 MED ORDER — FENTANYL CITRATE PF 50 MCG/ML IJ SOSY
100.0000 ug | PREFILLED_SYRINGE | Freq: Once | INTRAMUSCULAR | Status: AC
  Administered 2021-04-12: 100 ug via INTRAVENOUS
  Filled 2021-04-12: qty 2

## 2021-04-12 MED ORDER — SUCRALFATE 1 GM/10ML PO SUSP: 1.0000 g | Freq: Three times a day (TID) | ORAL | 0 refills | Status: AC

## 2021-04-12 MED ORDER — PANTOPRAZOLE SODIUM 40 MG PO TBEC: 40.0000 mg | DELAYED_RELEASE_TABLET | Freq: Every day | ORAL | 1 refills | Status: AC

## 2021-04-12 NOTE — Discharge Instructions (Signed)
Your CT scan and blood work are reassuring. Your symptoms are likely related inflammation in the bowel. Please take the medications as prescribed.  See primary care or return to the ER for symptoms of concern.

## 2021-04-12 NOTE — ED Triage Notes (Signed)
Pt comes into the ED via POV c/o right side abd pain and vomiting that has been ongoing x 3 days.

## 2021-04-12 NOTE — ED Provider Notes (Signed)
[facility] Emergency Department Provider Note ____________________________________________   Event Date/Time   First MD Initiated Contact with Patient 04/12/21 1456     (approximate)  I have reviewed the triage vital signs and the nursing notes.   HISTORY  Chief Complaint Abdominal Pain and Emesis  HPI Devin Graham. is a 30 y.o. male with history of anxiety presents to the emergency department for treatment and evaluation of abdominal pain that has been progressing over the week.  No alleviating measures attempted prior to arrival.  No fever.  He has had some nausea with no vomiting..         Past Medical History:  Diagnosis Date   Anxiety    Cellulitis 04/2019   FACIAL    Patient Active Problem List   Diagnosis Date Noted   Cellulitis 05/06/2019   SHOULDER PAIN, LEFT 03/05/2008   CERVICAL STRAIN, ACUTE 03/05/2008   GERD 05/13/2007    Past Surgical History:  Procedure Laterality Date   ADENOIDECTOMY     HERNIA REPAIR      Prior to Admission medications   Medication Sig Start Date End Date Taking? Authorizing Provider  pantoprazole (PROTONIX) 40 MG tablet Take 1 tablet (40 mg total) by mouth daily. 04/12/21 04/12/22 Yes Adeeb Konecny B, FNP  sucralfate (CARAFATE) 1 GM/10ML suspension Take 10 mLs (1 g total) by mouth 4 (four) times daily -  with meals and at bedtime. 04/12/21  Yes Chamille Werntz B, FNP  doxycycline (VIBRA-TABS) 100 MG tablet Take 1 tablet (100 mg total) by mouth every 12 (twelve) hours. 05/15/19   Dana Allan, MD  ibuprofen (ADVIL) 600 MG tablet Take 1 tablet (600 mg total) by mouth every 6 (six) hours as needed for moderate pain. 05/10/19   Dana Allan, MD    Allergies Patient has no known allergies.  History reviewed. No pertinent family history.  Social History Social History   Tobacco Use   Smoking status: Every Day    Packs/day: 1.50    Years: 10.00    Pack years: 15.00    Types: Cigarettes    Smokeless tobacco: Never  Vaping Use   Vaping Use: Never used  Substance Use Topics   Alcohol use: Yes    Comment: OCCASIONAL   Drug use: No    Review of Systems  Constitutional: No fever/chills Eyes: No visual changes. ENT: No sore throat. Cardiovascular: Denies chest pain. Respiratory: Denies shortness of breath. Gastrointestinal: Positive for abdominal pain.  Positive for nausea, no vomiting.  No diarrhea.  No constipation. Genitourinary: Negative for dysuria. Musculoskeletal: Negative for back pain. Skin: Negative for rash. Neurological: Negative for headaches, focal weakness or numbness.  ____________________________________________   PHYSICAL EXAM:  VITAL SIGNS: ED Triage Vitals  Enc Vitals Group     BP 04/12/21 1245 (!) 125/101     Pulse Rate 04/12/21 1245 85     Resp 04/12/21 1245 17     Temp 04/12/21 1245 97.8 F (36.6 C)     Temp Source 04/12/21 1245 Oral     SpO2 04/12/21 1245 93 %     Weight 04/12/21 1238 214 lb 4.6 oz (97.2 kg)     Height 04/12/21 1238 5\' 10"  (1.778 m)     Head Circumference --      Peak Flow --      Pain Score 04/12/21 1238 6     Pain Loc --      Pain Edu? --      Excl.  in GC? --     Constitutional: Alert and oriented. Overall well appearing and in no acute distress. Eyes: Conjunctivae are normal. Head: Atraumatic. Nose: No congestion/rhinnorhea. Mouth/Throat: Mucous membranes are moist.  Oropharynx non-erythematous. Neck: No stridor.   Hematological/Lymphatic/Immunilogical: No cervical lymphadenopathy. Cardiovascular: Normal rate, regular rhythm. Grossly normal heart sounds.  Good peripheral circulation. Respiratory: Normal respiratory effort.  No retractions. Lungs CTAB. Gastrointestinal: Soft and tender in right abdomen and epigastric area. No distention. Musculoskeletal: No lower extremity tenderness nor edema.  No joint effusions. Neurologic:  Normal speech and language. No gross focal neurologic deficits are  appreciated. Skin:  Skin is warm, dry and intact. No rash noted. Psychiatric: Mood and affect are normal. Speech and behavior are normal.  ____________________________________________   LABS (all labs ordered are listed, but only abnormal results are displayed)  Labs Reviewed  COMPREHENSIVE METABOLIC PANEL - Abnormal; Notable for the following components:      Result Value   Glucose, Bld 101 (*)    AST 13 (*)    Total Bilirubin 1.8 (*)    All other components within normal limits  RESP PANEL BY RT-PCR (FLU A&B, COVID) ARPGX2  LIPASE, BLOOD  CBC   ____________________________________________  EKG  Not indicated. ____________________________________________  RADIOLOGY  ED MD interpretation:    Nonspecific mucosal edema of the pylorus and antrum.  Infectious versus inflammatory gastritis.  I, Kem Boroughs, personally viewed and evaluated these images (plain radiographs) as part of my medical decision making, as well as reviewing the written report by the radiologist.  Official radiology report(s): CT ABDOMEN PELVIS W CONTRAST  Result Date: 04/12/2021 CLINICAL DATA:  Abdominal distension. Right-sided abdominal pain and vomiting ongoing for 3 days. EXAM: CT ABDOMEN AND PELVIS WITH CONTRAST TECHNIQUE: Multidetector CT imaging of the abdomen and pelvis was performed using the standard protocol following bolus administration of intravenous contrast. CONTRAST:  OMNIPAQUE IOHEXOL 350 MG/ML SOLN COMPARISON:  None. FINDINGS: Lower chest: Bilateral lower lobe subsegmental atelectasis. Hepatobiliary: No focal liver abnormality. No gallstones, gallbladder wall thickening, or pericholecystic fluid. No biliary dilatation. Pancreas: No focal lesion. Normal pancreatic contour. No surrounding inflammatory changes. No main pancreatic ductal dilatation. Spleen: Normal in size without focal abnormality. Adrenals/Urinary Tract: No adrenal nodule bilaterally. Bilateral kidneys enhance  symmetrically. No hydronephrosis. No hydroureter. The urinary bladder is unremarkable. Stomach/Bowel: Mucosal bowel wall thickening with hyper mucosal hyperenhancement of the pylorus and gastric antrum. No surrounding fat stranding. Otherwise remainder of the small and large bowel with no evidence of bowel wall thickening or dilatation. Appendix appears normal. Vascular/Lymphatic: No abdominal aorta or iliac aneurysm. Mild atherosclerotic plaque of the aorta and its branches. No abdominal, pelvic, or inguinal lymphadenopathy. Reproductive: Prostate is unremarkable. Other: No intraperitoneal free fluid. No intraperitoneal free gas. No organized fluid collection. Musculoskeletal: No abdominal wall hernia or abnormality. No suspicious lytic or blastic osseous lesions. No acute displaced fracture. IMPRESSION: Nonspecific mucosal edema of the pylorus and antrum. Differential diagnosis for etiology includes infectious or inflammatory gastritis. Please note CT of limited sensitivity for peptic ulcer. Consider direct visualization. Electronically Signed   By: Tish Frederickson M.D.   On: 04/12/2021 16:05    ____________________________________________   PROCEDURES  Procedure(s) performed (including Critical Care):  Procedures  ____________________________________________   INITIAL IMPRESSION / ASSESSMENT AND PLAN     30 year old male presenting to the emergency department for treatment and evaluation of abdominal pain.  See HPI for further details.  While awaiting ER room assignment, protocol labs were drawn and are overall  reassuring.  He has no leukocytosis, lipase is normal, CMP P is normal with the exception of a very slight elevation in his bilirubin.  He is not febrile. Will get CT of the abdomen and pelvis due to location of pain.   DIFFERENTIAL DIAGNOSIS  Diverticulitis, diverticulosis, colitis, gastritis, cholecystitis, appendicitis.  ED COURSE  CT of the abdomen pelvis shows inflammatory  versus infectious gastritis.  Again, he has no fever, he is not tachycardic, no leukocytosis and therefore low suspicion for infection.  He will be treated with Carafate and Protonix and advised to follow-up with primary care or GI for symptoms that do not improve with time and medications.  For symptoms that change or worsen if he is unable to schedule appointment, he is to return to the emergency department.    ___________________________________________   FINAL CLINICAL IMPRESSION(S) / ED DIAGNOSES  Final diagnoses:  Acute gastritis without hemorrhage, unspecified gastritis type     ED Discharge Orders          Ordered    pantoprazole (PROTONIX) 40 MG tablet  Daily        04/12/21 1647    sucralfate (CARAFATE) 1 GM/10ML suspension  3 times daily with meals & bedtime        04/12/21 1647             Devin Graham. was evaluated in Emergency Department on 04/13/2021 for the symptoms described in the history of present illness. He was evaluated in the context of the global COVID-19 pandemic, which necessitated consideration that the patient might be at risk for infection with the SARS-CoV-2 virus that causes COVID-19. Institutional protocols and algorithms that pertain to the evaluation of patients at risk for COVID-19 are in a state of rapid change based on information released by regulatory bodies including the CDC and federal and state organizations. These policies and algorithms were followed during the patient's care in the ED.   Note:  This document was prepared using Dragon voice recognition software and may include unintentional dictation errors.    Chinita Pester, FNP 04/13/21 1412    Minna Antis, MD 04/15/21 1458

## 2021-11-23 ENCOUNTER — Other Ambulatory Visit: Payer: Self-pay

## 2021-11-23 ENCOUNTER — Emergency Department (HOSPITAL_COMMUNITY): Payer: Self-pay

## 2021-11-23 ENCOUNTER — Emergency Department (HOSPITAL_COMMUNITY)
Admission: EM | Admit: 2021-11-23 | Discharge: 2021-11-23 | Disposition: A | Discharge status: Home / Self Care | Payer: Self-pay | Attending: Emergency Medicine | Admitting: Emergency Medicine

## 2021-11-23 DIAGNOSIS — S8011XA Contusion of right lower leg, initial encounter: Secondary | ICD-10-CM | POA: Insufficient documentation

## 2021-11-23 DIAGNOSIS — Z23 Encounter for immunization: Secondary | ICD-10-CM | POA: Insufficient documentation

## 2021-11-23 DIAGNOSIS — S0181XA Laceration without foreign body of other part of head, initial encounter: Secondary | ICD-10-CM | POA: Insufficient documentation

## 2021-11-23 DIAGNOSIS — R55 Syncope and collapse: Secondary | ICD-10-CM

## 2021-11-23 DIAGNOSIS — S1093XA Contusion of unspecified part of neck, initial encounter: Secondary | ICD-10-CM | POA: Insufficient documentation

## 2021-11-23 DIAGNOSIS — Y906 Blood alcohol level of 120-199 mg/100 ml: Secondary | ICD-10-CM | POA: Insufficient documentation

## 2021-11-23 DIAGNOSIS — S8012XA Contusion of left lower leg, initial encounter: Secondary | ICD-10-CM | POA: Insufficient documentation

## 2021-11-23 LAB — CBC WITH DIFFERENTIAL/PLATELET
Abs Immature Granulocytes: 0.07 10*3/uL (ref 0.00–0.07)
Basophils Absolute: 0.1 10*3/uL (ref 0.0–0.1)
Basophils Relative: 0 %
Eosinophils Absolute: 0.1 10*3/uL (ref 0.0–0.5)
Eosinophils Relative: 0 %
HCT: 50.4 % (ref 39.0–52.0)
Hemoglobin: 17.2 g/dL — ABNORMAL HIGH (ref 13.0–17.0)
Immature Granulocytes: 1 %
Lymphocytes Relative: 18 %
Lymphs Abs: 2.2 10*3/uL (ref 0.7–4.0)
MCH: 31.7 pg (ref 26.0–34.0)
MCHC: 34.1 g/dL (ref 30.0–36.0)
MCV: 92.8 fL (ref 80.0–100.0)
Monocytes Absolute: 0.8 10*3/uL (ref 0.1–1.0)
Monocytes Relative: 7 %
Neutro Abs: 8.8 10*3/uL — ABNORMAL HIGH (ref 1.7–7.7)
Neutrophils Relative %: 74 %
Platelets: 251 10*3/uL (ref 150–400)
RBC: 5.43 MIL/uL (ref 4.22–5.81)
RDW: 12.1 % (ref 11.5–15.5)
WBC: 12 10*3/uL — ABNORMAL HIGH (ref 4.0–10.5)
nRBC: 0 % (ref 0.0–0.2)

## 2021-11-23 LAB — I-STAT CHEM 8, ED
BUN: 8 mg/dL (ref 6–20)
Calcium, Ion: 1.14 mmol/L — ABNORMAL LOW (ref 1.15–1.40)
Chloride: 105 mmol/L (ref 98–111)
Creatinine, Ser: 1.1 mg/dL (ref 0.61–1.24)
Glucose, Bld: 101 mg/dL — ABNORMAL HIGH (ref 70–99)
HCT: 50 % (ref 39.0–52.0)
Hemoglobin: 17 g/dL (ref 13.0–17.0)
Potassium: 3.6 mmol/L (ref 3.5–5.1)
Sodium: 140 mmol/L (ref 135–145)
TCO2: 19 mmol/L — ABNORMAL LOW (ref 22–32)

## 2021-11-23 LAB — COMPREHENSIVE METABOLIC PANEL
ALT: 17 U/L (ref 0–44)
AST: 16 U/L (ref 15–41)
Albumin: 4.1 g/dL (ref 3.5–5.0)
Alkaline Phosphatase: 83 U/L (ref 38–126)
Anion gap: 15 (ref 5–15)
BUN: 9 mg/dL (ref 6–20)
CO2: 18 mmol/L — ABNORMAL LOW (ref 22–32)
Calcium: 9.3 mg/dL (ref 8.9–10.3)
Chloride: 106 mmol/L (ref 98–111)
Creatinine, Ser: 0.99 mg/dL (ref 0.61–1.24)
GFR, Estimated: >60 mL/min (ref >60)
Glucose, Bld: 105 mg/dL — ABNORMAL HIGH (ref 70–99)
Potassium: 3.6 mmol/L (ref 3.5–5.1)
Sodium: 139 mmol/L (ref 135–145)
Total Bilirubin: 1.1 mg/dL (ref 0.3–1.2)
Total Protein: 6.6 g/dL (ref 6.5–8.1)

## 2021-11-23 LAB — ETHANOL: Alcohol, Ethyl (B): 121 mg/dL — ABNORMAL HIGH (ref <10)

## 2021-11-23 LAB — MAGNESIUM: Magnesium: 2.4 mg/dL (ref 1.7–2.4)

## 2021-11-23 MED ORDER — TETANUS-DIPHTH-ACELL PERTUSSIS 5-2.5-18.5 LF-MCG/0.5 IM SUSY
0.5000 mL | PREFILLED_SYRINGE | Freq: Once | INTRAMUSCULAR | Status: AC
  Administered 2021-11-23: 0.5 mL via INTRAMUSCULAR
  Filled 2021-11-23: qty 0.5

## 2021-11-23 MED ORDER — IOHEXOL 350 MG/ML SOLN
75.0000 mL | Freq: Once | INTRAVENOUS | Status: AC | PRN
  Administered 2021-11-23: 75 mL via INTRAVENOUS

## 2021-11-23 NOTE — ED Notes (Signed)
~  2cm Laceration to left jaw-bleeding controlled, contusions to left chest, difficulty opening right eye. ?

## 2021-11-23 NOTE — Discharge Instructions (Signed)
Follow up with your doctor.  Return for worsening symptoms.  
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
## 2023-01-07 IMAGING — CT CT ABD-PELV W/ CM
2 of 4 series · 16 of 46 positions shown, 18 images · IV contrast (APPLIED)
Comparison: None.

CLINICAL DATA: Abdominal distension. Right-sided abdominal pain and
vomiting ongoing for 3 days.

EXAM:
CT ABDOMEN AND PELVIS WITH CONTRAST
TECHNIQUE: Multidetector CT imaging of the abdomen and pelvis was performed
using the standard protocol following bolus administration of
intravenous contrast.
CONTRAST:  100mL OMNIPAQUE IOHEXOL 350 MG/ML SOLN

[Series 2: routine abd/pel with · axial · 0.82mm/px · z∈[-1294,-804]mm · 13 of 108 slices shown, 15 images]
[im 5/108  soft-tissue]
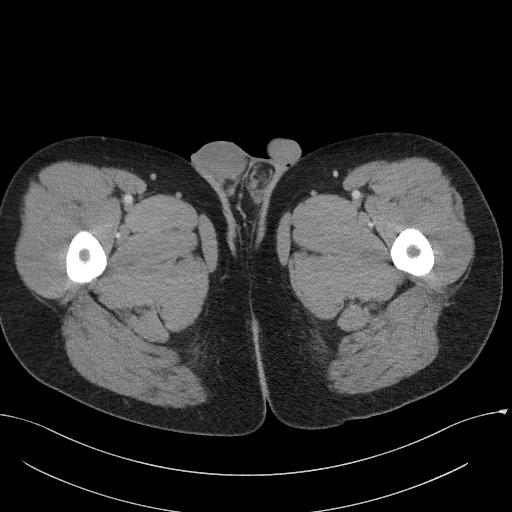
[im 5/108  bone]
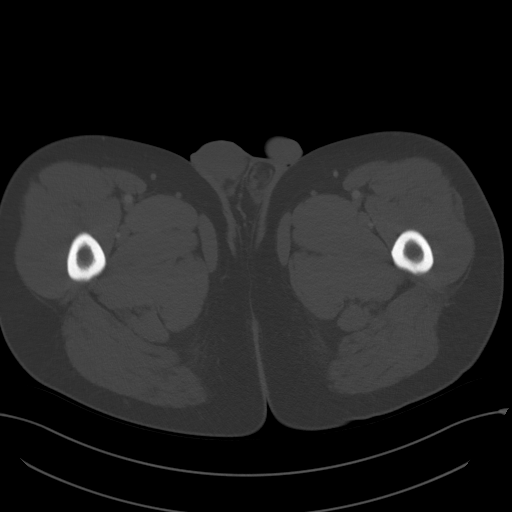
[im 14/108  soft-tissue]
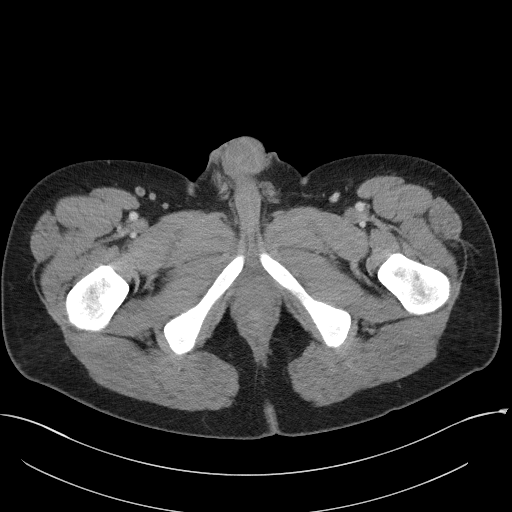
[im 23/108  soft-tissue]
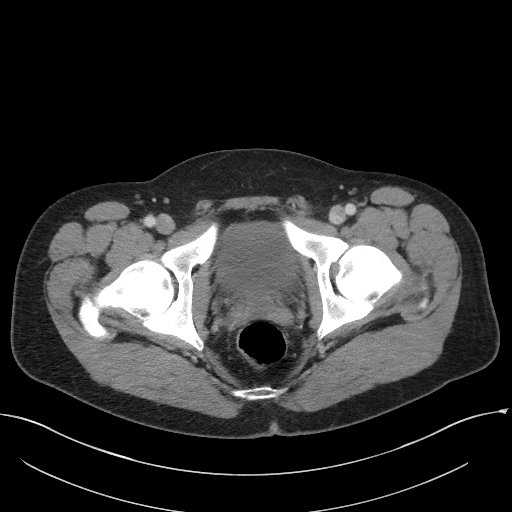
[im 32/108  soft-tissue]
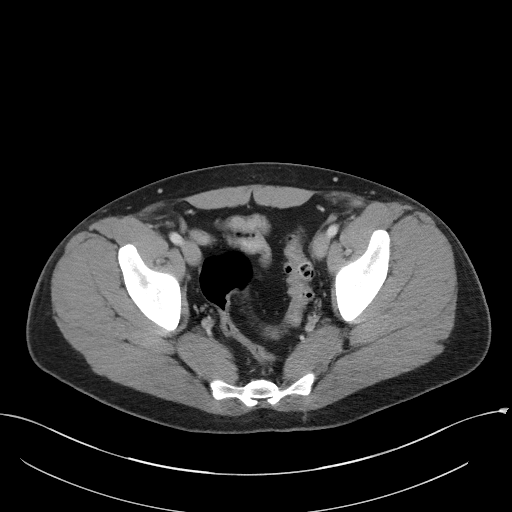
[im 36/108  soft-tissue]
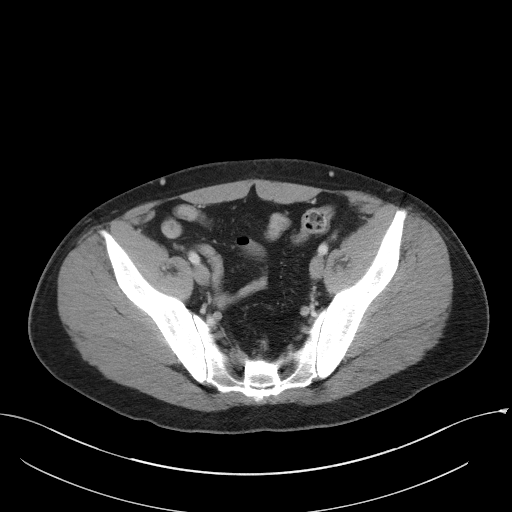
[im 45/108  soft-tissue]
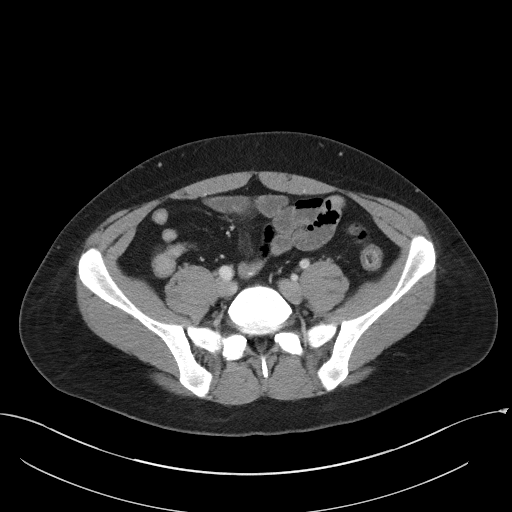
[im 54/108  soft-tissue]
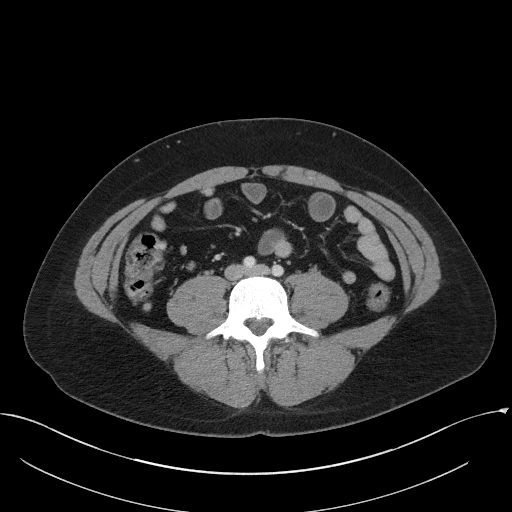
[im 63/108  soft-tissue]
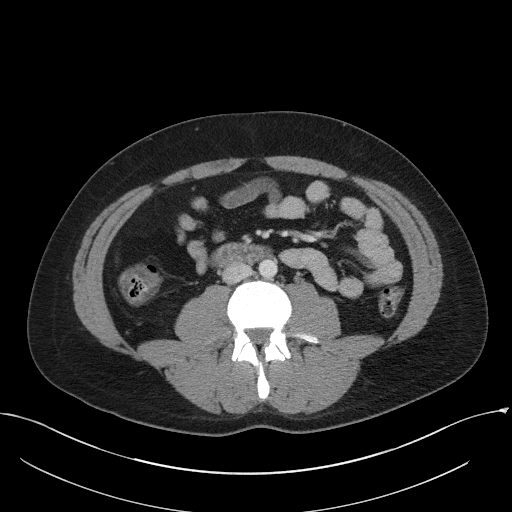
[im 72/108  soft-tissue]
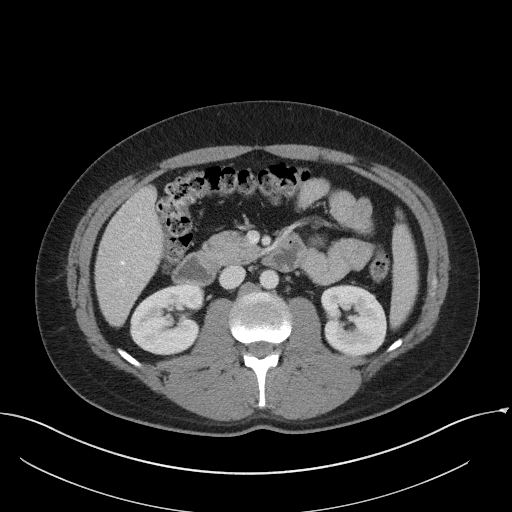
[im 72/108  bone]
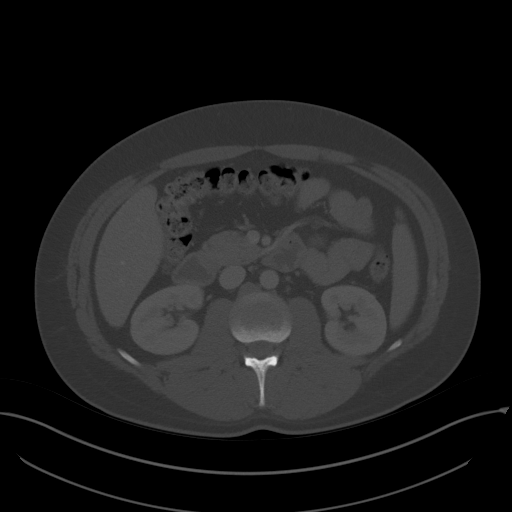
[im 76/108  soft-tissue]
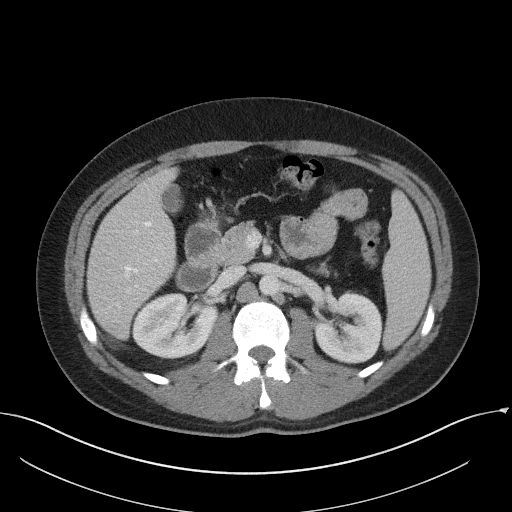
[im 85/108  soft-tissue]
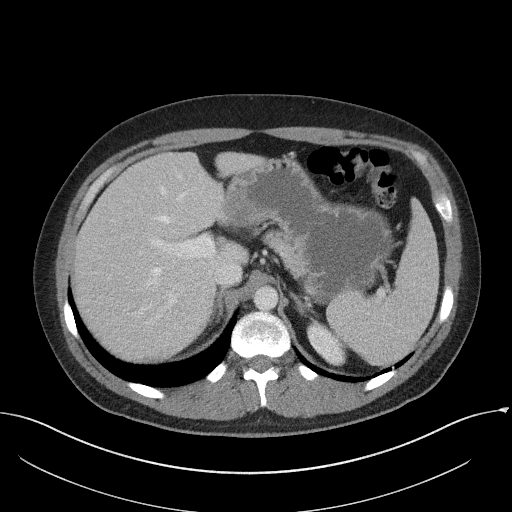
[im 94/108  soft-tissue]
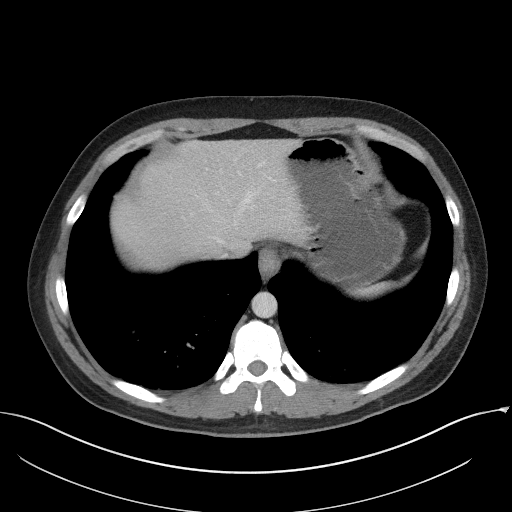
[im 103/108  soft-tissue]
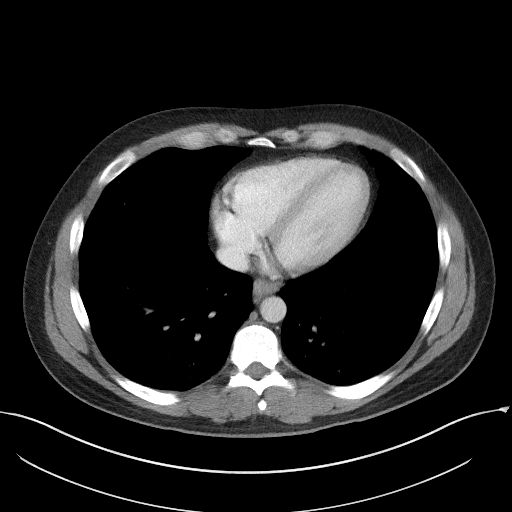

[Series 5: coronal st · coronal · 0.84mm/px · 3 of 96 slices shown]
[im 32/96  soft-tissue]
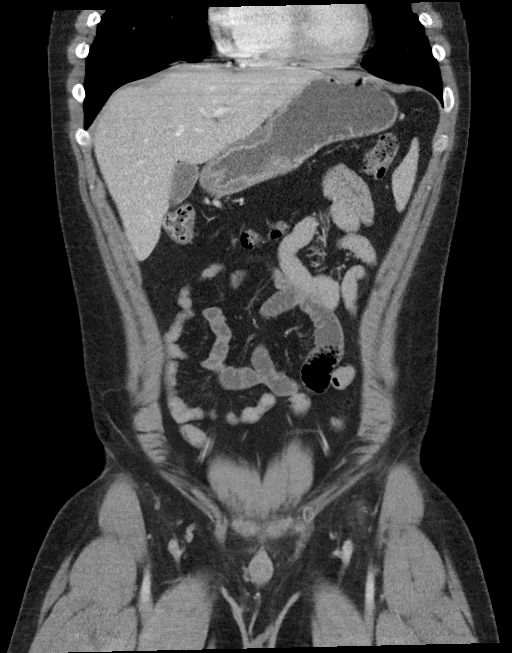
[im 43/96  soft-tissue]
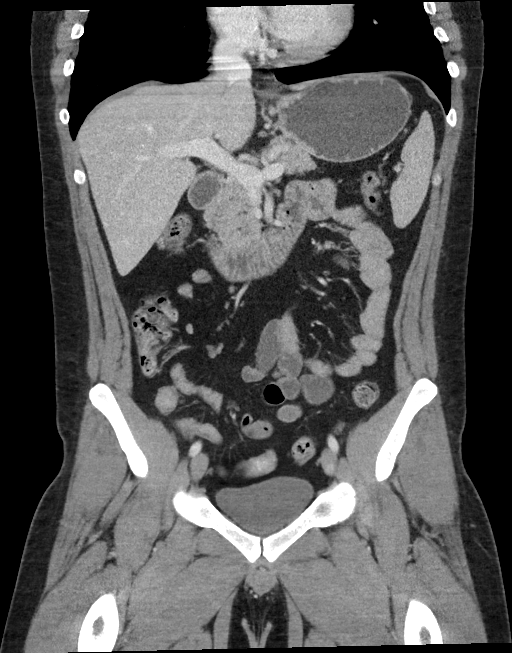
[im 53/96  soft-tissue]
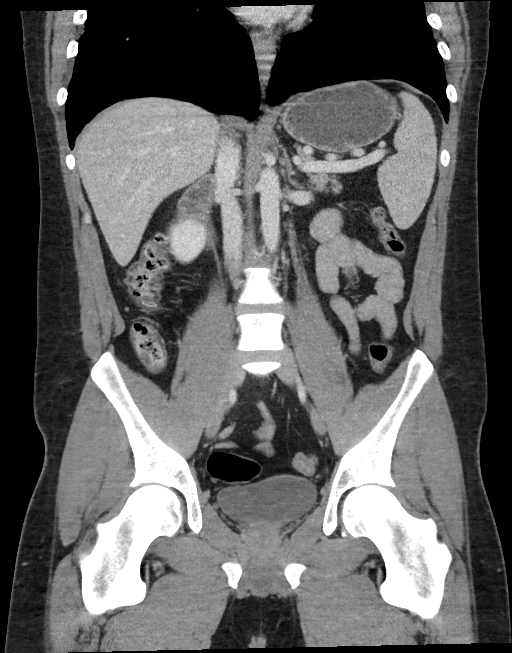

[16 of 46 positions shown; findings below may reference images not displayed]

FINDINGS: Lower chest: Bilateral lower lobe subsegmental atelectasis.

Hepatobiliary: No focal liver abnormality. No gallstones,
gallbladder wall thickening, or pericholecystic fluid. No biliary
dilatation.

Pancreas: No focal lesion. Normal pancreatic contour. No surrounding
inflammatory changes. No main pancreatic ductal dilatation.

Spleen: Normal in size without focal abnormality.

Adrenals/Urinary Tract:

No adrenal nodule bilaterally.

Bilateral kidneys enhance symmetrically.

No hydronephrosis. No hydroureter.

The urinary bladder is unremarkable.

Stomach/Bowel: Mucosal bowel wall thickening with hyper mucosal
hyperenhancement of the pylorus and gastric antrum. No surrounding
fat stranding. Otherwise remainder of the small and large bowel with
no evidence of bowel wall thickening or dilatation. Appendix appears
normal.

Vascular/Lymphatic: No abdominal aorta or iliac aneurysm. Mild
atherosclerotic plaque of the aorta and its branches. No abdominal,
pelvic, or inguinal lymphadenopathy.

Reproductive: Prostate is unremarkable.

Other: No intraperitoneal free fluid. No intraperitoneal free gas.
No organized fluid collection.

Musculoskeletal:

No abdominal wall hernia or abnormality.

No suspicious lytic or blastic osseous lesions. No acute displaced
fracture.
IMPRESSION: Nonspecific mucosal edema of the pylorus and antrum. Differential
diagnosis for etiology includes infectious or inflammatory
gastritis. Please note CT of limited sensitivity for peptic ulcer.
Consider direct visualization.

## 2023-08-20 IMAGING — CT CT ANGIO HEAD-NECK (W OR W/O PERF)
1 of 11 series · 5 of 33 positions shown · IV contrast (APPLIED)
Comparison: 10/26/2017 CT head, no prior CTA.

CLINICAL DATA: Tremulous motions, than unresponsive

EXAM:
CT ANGIOGRAPHY HEAD AND NECK
TECHNIQUE: Multidetector CT imaging of the head and neck was performed using
the standard protocol during bolus administration of intravenous
contrast. Multiplanar CT image reconstructions and MIPs were
obtained to evaluate the vascular anatomy. Carotid stenosis
measurements (when applicable) are obtained utilizing NASCET
criteria, using the distal internal carotid diameter as the
denominator.

[Series 9: ax thins · axial · 0.39mm/px · z∈[-276,-46]mm · 5 of 353 slices shown]
[im 59/353  soft-tissue]
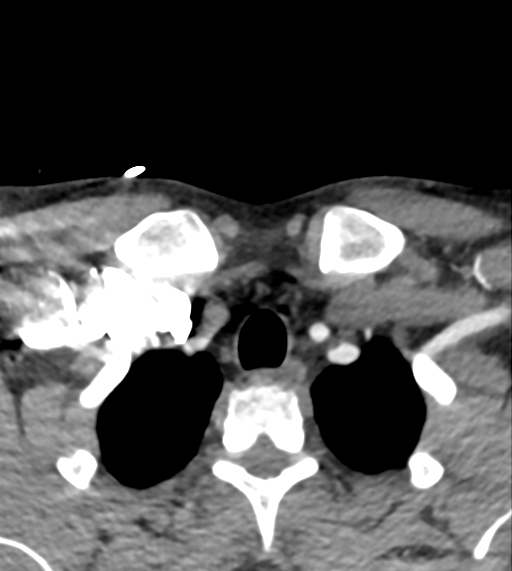
[im 118/353  bone]
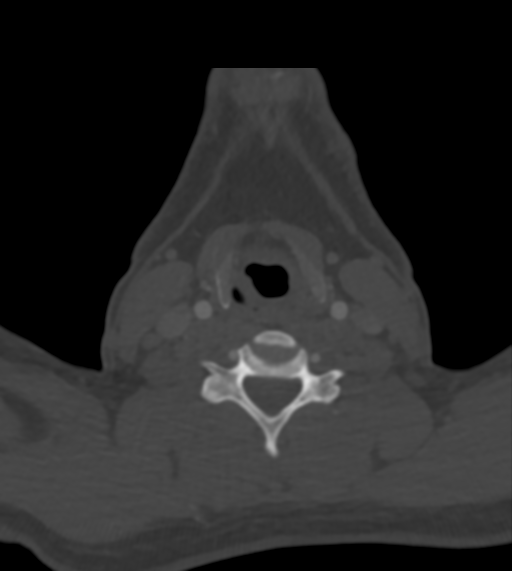
[im 177/353  soft-tissue]
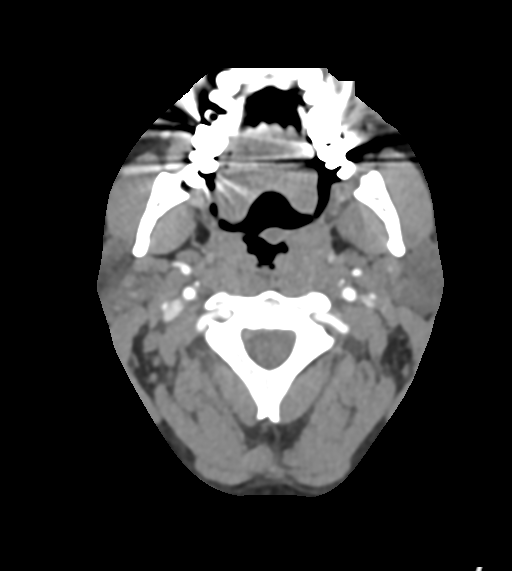
[im 235/353  bone]
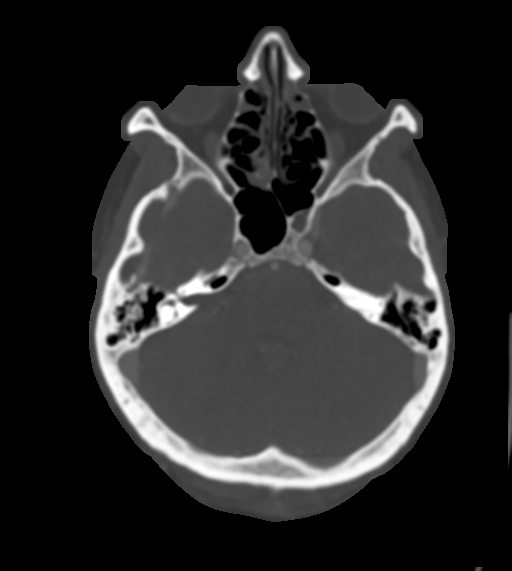
[im 294/353  soft-tissue]
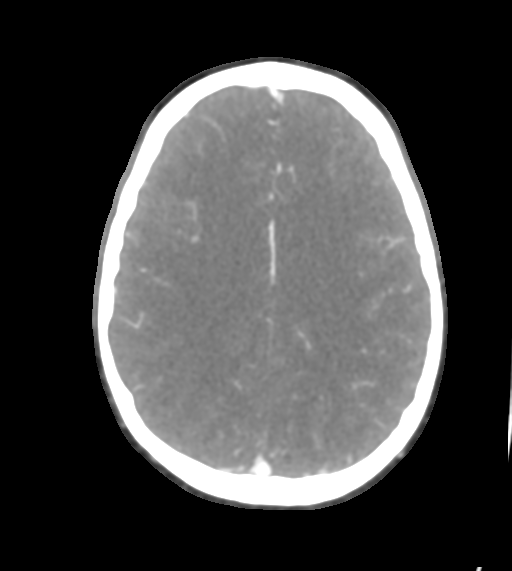

[5 of 33 positions shown; findings below may reference images not displayed]

RADIATION DOSE REDUCTION: This exam was performed according to the
departmental dose-optimization program which includes automated
exposure control, adjustment of the mA and/or kV according to
patient size and/or use of iterative reconstruction technique.

CONTRAST:  75mL OMNIPAQUE IOHEXOL 350 MG/ML SOLN
FINDINGS: CT HEAD FINDINGS

Brain: No evidence of acute infarction, hemorrhage, cerebral edema,
mass, mass effect, or midline shift. No hydrocephalus or extra-axial
fluid collection.

Vascular: No hyperdense vessel.

Skull: Normal. Negative for fracture or focal lesion.

Sinuses/Orbits: Mucosal thickening in the ethmoid air cells, frontal
sinuses, and right sphenoid sinus

Other: The mastoid air cells are well aerated.

CTA NECK FINDINGS

Aortic arch: Standard branching. Imaged portion shows no evidence of
aneurysm or dissection. No significant stenosis of the major arch
vessel origins.

Right carotid system: No evidence of dissection, occlusion, or
hemodynamically significant stenosis (greater than 50%).

Left carotid system: No evidence of dissection, occlusion, or
hemodynamically significant stenosis (greater than 50%).

Vertebral arteries: Codominant. No evidence of dissection,
occlusion, or hemodynamically significant stenosis (greater than
50%).

Skeleton: No acute osseous abnormality.

Other neck: Negative.

Upper chest: No focal pulmonary opacity or pleural effusion.

Review of the MIP images confirms the above findings

CTA HEAD FINDINGS

Anterior circulation: Both internal carotid arteries are patent to
the termini, without significant stenosis.

A1 segments patent. Normal anterior communicating artery. Anterior
cerebral arteries are patent to their distal aspects.

No M1 stenosis or occlusion. Normal MCA bifurcations. Distal MCA
branches perfused and symmetric.

Posterior circulation: Vertebral arteries patent to the
vertebrobasilar junction without stenosis. Posterior inferior
cerebral arteries patent bilaterally.

Basilar patent to its distal aspect. Superior cerebellar arteries
patent bilaterally.

Patent right P1. Near fetal origin of the left PCA, with a
diminutive left P1 and a patent left posterior communicating artery.
PCAs perfused to their distal aspects without stenosis. The
bilateral posterior communicating arteries are visualized.

Venous sinuses: As permitted by contrast timing, patent.

Anatomic variants: Near fetal origin of the left PCA.

Review of the MIP images confirms the above findings
IMPRESSION: 1.  No acute intracranial process.
2.  No intracranial large vessel occlusion or significant stenosis.
3.  No hemodynamically significant stenosis in the neck.

## 2023-10-18 ENCOUNTER — Emergency Department (HOSPITAL_COMMUNITY)
Admission: EM | Admit: 2023-10-18 | Discharge: 2023-10-19 | Disposition: A | Discharge status: Home / Self Care | Attending: Emergency Medicine | Admitting: Emergency Medicine

## 2023-10-18 ENCOUNTER — Encounter (HOSPITAL_COMMUNITY): Payer: Self-pay | Admitting: Emergency Medicine

## 2023-10-18 ENCOUNTER — Emergency Department (HOSPITAL_COMMUNITY)

## 2023-10-18 ENCOUNTER — Other Ambulatory Visit: Payer: Self-pay

## 2023-10-18 DIAGNOSIS — S0101XD Laceration without foreign body of scalp, subsequent encounter: Secondary | ICD-10-CM | POA: Diagnosis not present

## 2023-10-18 DIAGNOSIS — Y908 Blood alcohol level of 240 mg/100 ml or more: Secondary | ICD-10-CM | POA: Insufficient documentation

## 2023-10-18 DIAGNOSIS — Z23 Encounter for immunization: Secondary | ICD-10-CM | POA: Diagnosis not present

## 2023-10-18 DIAGNOSIS — K449 Diaphragmatic hernia without obstruction or gangrene: Secondary | ICD-10-CM | POA: Insufficient documentation

## 2023-10-18 DIAGNOSIS — S0101XA Laceration without foreign body of scalp, initial encounter: Secondary | ICD-10-CM | POA: Insufficient documentation

## 2023-10-18 DIAGNOSIS — F10220 Alcohol dependence with intoxication, uncomplicated: Secondary | ICD-10-CM | POA: Diagnosis not present

## 2023-10-18 DIAGNOSIS — Y92410 Unspecified street and highway as the place of occurrence of the external cause: Secondary | ICD-10-CM | POA: Insufficient documentation

## 2023-10-18 DIAGNOSIS — S20211A Contusion of right front wall of thorax, initial encounter: Secondary | ICD-10-CM | POA: Diagnosis not present

## 2023-10-18 DIAGNOSIS — K920 Hematemesis: Secondary | ICD-10-CM | POA: Diagnosis present

## 2023-10-18 DIAGNOSIS — S60811A Abrasion of right wrist, initial encounter: Secondary | ICD-10-CM | POA: Diagnosis not present

## 2023-10-18 DIAGNOSIS — S0990XA Unspecified injury of head, initial encounter: Secondary | ICD-10-CM | POA: Diagnosis present

## 2023-10-18 DIAGNOSIS — F10920 Alcohol use, unspecified with intoxication, uncomplicated: Secondary | ICD-10-CM | POA: Insufficient documentation

## 2023-10-18 LAB — COMPREHENSIVE METABOLIC PANEL
ALT: 20 U/L (ref 0–44)
AST: 16 U/L (ref 15–41)
Albumin: 4.1 g/dL (ref 3.5–5.0)
Alkaline Phosphatase: 86 U/L (ref 38–126)
Anion gap: 12 (ref 5–15)
BUN: 10 mg/dL (ref 6–20)
CO2: 21 mmol/L — ABNORMAL LOW (ref 22–32)
Calcium: 9.1 mg/dL (ref 8.9–10.3)
Chloride: 108 mmol/L (ref 98–111)
Creatinine, Ser: 0.89 mg/dL (ref 0.61–1.24)
GFR, Estimated: >60 mL/min (ref >60)
Glucose, Bld: 114 mg/dL — ABNORMAL HIGH (ref 70–99)
Potassium: 3.3 mmol/L — ABNORMAL LOW (ref 3.5–5.1)
Sodium: 141 mmol/L (ref 135–145)
Total Bilirubin: 0.9 mg/dL (ref 0.0–1.2)
Total Protein: 6.9 g/dL (ref 6.5–8.1)

## 2023-10-18 LAB — ETHANOL: Alcohol, Ethyl (B): 262 mg/dL — ABNORMAL HIGH (ref <10)

## 2023-10-18 LAB — I-STAT CHEM 8, ED
BUN: 10 mg/dL (ref 6–20)
Calcium, Ion: 1.05 mmol/L — ABNORMAL LOW (ref 1.15–1.40)
Chloride: 105 mmol/L (ref 98–111)
Creatinine, Ser: 1.2 mg/dL (ref 0.61–1.24)
Glucose, Bld: 106 mg/dL — ABNORMAL HIGH (ref 70–99)
HCT: 49 % (ref 39.0–52.0)
Hemoglobin: 16.7 g/dL (ref 13.0–17.0)
Potassium: 3.4 mmol/L — ABNORMAL LOW (ref 3.5–5.1)
Sodium: 142 mmol/L (ref 135–145)
TCO2: 21 mmol/L — ABNORMAL LOW (ref 22–32)

## 2023-10-18 LAB — I-STAT CG4 LACTIC ACID, ED: Lactic Acid, Venous: 2.8 mmol/L (ref 0.5–1.9)

## 2023-10-18 LAB — CBC
HCT: 47.1 % (ref 39.0–52.0)
Hemoglobin: 16.6 g/dL (ref 13.0–17.0)
MCH: 31.2 pg (ref 26.0–34.0)
MCHC: 35.2 g/dL (ref 30.0–36.0)
MCV: 88.5 fL (ref 80.0–100.0)
Platelets: 261 10*3/uL (ref 150–400)
RBC: 5.32 MIL/uL (ref 4.22–5.81)
RDW: 11.9 % (ref 11.5–15.5)
WBC: 12 10*3/uL — ABNORMAL HIGH (ref 4.0–10.5)
nRBC: 0 % (ref 0.0–0.2)

## 2023-10-18 LAB — PROTIME-INR
INR: 1.1 (ref 0.8–1.2)
Prothrombin Time: 14.1 s (ref 11.4–15.2)

## 2023-10-18 LAB — SAMPLE TO BLOOD BANK

## 2023-10-18 MED ORDER — LACTATED RINGERS IV BOLUS: 1000.0000 mL | Freq: Once | INTRAVENOUS | Status: DC

## 2023-10-18 MED ORDER — TETANUS-DIPHTH-ACELL PERTUSSIS 5-2.5-18.5 LF-MCG/0.5 IM SUSY
0.5000 mL | PREFILLED_SYRINGE | Freq: Once | INTRAMUSCULAR | Status: AC
  Administered 2023-10-19: 0.5 mL via INTRAMUSCULAR
  Filled 2023-10-18: qty 0.5

## 2023-10-18 MED ORDER — IOHEXOL 350 MG/ML SOLN
75.0000 mL | Freq: Once | INTRAVENOUS | Status: AC | PRN
  Administered 2023-10-18: 75 mL via INTRAVENOUS

## 2023-10-18 MED ORDER — LORAZEPAM 2 MG/ML IJ SOLN
2.0000 mg | Freq: Once | INTRAMUSCULAR | Status: AC
Start: 2023-10-18 — End: 2023-10-18

## 2023-10-18 MED ORDER — LORAZEPAM 2 MG/ML IJ SOLN
INTRAMUSCULAR | Status: AC
  Administered 2023-10-18: 2 mg via INTRAVENOUS
  Filled 2023-10-18: qty 1

## 2023-10-18 MED ORDER — THIAMINE HCL 100 MG/ML IJ SOLN
100.0000 mg | Freq: Once | INTRAMUSCULAR | Status: AC
  Administered 2023-10-19: 100 mg via INTRAVENOUS
  Filled 2023-10-18: qty 2

## 2023-10-18 MED ORDER — SODIUM CHLORIDE 0.9 % IV BOLUS
1000.0000 mL | Freq: Once | INTRAVENOUS | Status: AC
  Administered 2023-10-19: 1000 mL via INTRAVENOUS
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
## 2023-10-18 NOTE — ED Notes (Signed)
[facility] at bedside 
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
## 2023-10-18 NOTE — ED Notes (Signed)
 Pt transported to CT ?
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
## 2023-10-19 ENCOUNTER — Encounter (HOSPITAL_COMMUNITY): Payer: Self-pay

## 2023-10-19 ENCOUNTER — Emergency Department (HOSPITAL_COMMUNITY)
Admission: EM | Admit: 2023-10-19 | Discharge: 2023-10-19 | Disposition: A | Discharge status: Home / Self Care | Source: Home / Self Care | Attending: Emergency Medicine | Admitting: Emergency Medicine

## 2023-10-19 ENCOUNTER — Other Ambulatory Visit: Payer: Self-pay

## 2023-10-19 DIAGNOSIS — Y900 Blood alcohol level of less than 20 mg/100 ml: Secondary | ICD-10-CM | POA: Insufficient documentation

## 2023-10-19 DIAGNOSIS — F10220 Alcohol dependence with intoxication, uncomplicated: Secondary | ICD-10-CM | POA: Insufficient documentation

## 2023-10-19 DIAGNOSIS — S0101XD Laceration without foreign body of scalp, subsequent encounter: Secondary | ICD-10-CM | POA: Insufficient documentation

## 2023-10-19 DIAGNOSIS — K920 Hematemesis: Secondary | ICD-10-CM | POA: Insufficient documentation

## 2023-10-19 LAB — RAPID URINE DRUG SCREEN, HOSP PERFORMED
Amphetamines: POSITIVE — AB
Barbiturates: NOT DETECTED
Benzodiazepines: NOT DETECTED
Cocaine: POSITIVE — AB
Opiates: NOT DETECTED
Tetrahydrocannabinol: POSITIVE — AB

## 2023-10-19 LAB — CBC WITH DIFFERENTIAL/PLATELET
Abs Immature Granulocytes: 0.06 10*3/uL (ref 0.00–0.07)
Basophils Absolute: 0 10*3/uL (ref 0.0–0.1)
Basophils Relative: 0 %
Eosinophils Absolute: 0.1 10*3/uL (ref 0.0–0.5)
Eosinophils Relative: 1 %
HCT: 46.1 % (ref 39.0–52.0)
Hemoglobin: 16.3 g/dL (ref 13.0–17.0)
Immature Granulocytes: 1 %
Lymphocytes Relative: 19 %
Lymphs Abs: 2.2 10*3/uL (ref 0.7–4.0)
MCH: 31.8 pg (ref 26.0–34.0)
MCHC: 35.4 g/dL (ref 30.0–36.0)
MCV: 90 fL (ref 80.0–100.0)
Monocytes Absolute: 0.8 10*3/uL (ref 0.1–1.0)
Monocytes Relative: 7 %
Neutro Abs: 8.2 10*3/uL — ABNORMAL HIGH (ref 1.7–7.7)
Neutrophils Relative %: 72 %
Platelets: 216 10*3/uL (ref 150–400)
RBC: 5.12 MIL/uL (ref 4.22–5.81)
RDW: 12.1 % (ref 11.5–15.5)
WBC: 11.4 10*3/uL — ABNORMAL HIGH (ref 4.0–10.5)
nRBC: 0 % (ref 0.0–0.2)

## 2023-10-19 LAB — COMPREHENSIVE METABOLIC PANEL
ALT: 19 U/L (ref 0–44)
AST: 16 U/L (ref 15–41)
Albumin: 3.4 g/dL — ABNORMAL LOW (ref 3.5–5.0)
Alkaline Phosphatase: 71 U/L (ref 38–126)
Anion gap: 11 (ref 5–15)
BUN: 12 mg/dL (ref 6–20)
CO2: 20 mmol/L — ABNORMAL LOW (ref 22–32)
Calcium: 8.2 mg/dL — ABNORMAL LOW (ref 8.9–10.3)
Chloride: 108 mmol/L (ref 98–111)
Creatinine, Ser: 0.69 mg/dL (ref 0.61–1.24)
GFR, Estimated: >60 mL/min (ref >60)
Glucose, Bld: 91 mg/dL (ref 70–99)
Potassium: 3.2 mmol/L — ABNORMAL LOW (ref 3.5–5.1)
Sodium: 139 mmol/L (ref 135–145)
Total Bilirubin: 0.9 mg/dL (ref 0.0–1.2)
Total Protein: 5.8 g/dL — ABNORMAL LOW (ref 6.5–8.1)

## 2023-10-19 LAB — URINALYSIS, ROUTINE W REFLEX MICROSCOPIC
Bilirubin Urine: NEGATIVE
Glucose, UA: NEGATIVE mg/dL
Hgb urine dipstick: NEGATIVE
Ketones, ur: NEGATIVE mg/dL
Leukocytes,Ua: NEGATIVE
Nitrite: NEGATIVE
Protein, ur: NEGATIVE mg/dL
Specific Gravity, Urine: 1.01 (ref 1.005–1.030)
pH: 6 (ref 5.0–8.0)

## 2023-10-19 LAB — I-STAT CG4 LACTIC ACID, ED: Lactic Acid, Venous: 2.8 mmol/L (ref 0.5–1.9)

## 2023-10-19 LAB — LIPASE, BLOOD: Lipase: 32 U/L (ref 11–51)

## 2023-10-19 LAB — ETHANOL: Alcohol, Ethyl (B): 16 mg/dL — ABNORMAL HIGH (ref <10)

## 2023-10-19 MED ORDER — PANTOPRAZOLE SODIUM 40 MG PO TBEC: 40.0000 mg | DELAYED_RELEASE_TABLET | Freq: Every day | ORAL | 0 refills | Status: AC

## 2023-10-19 MED ORDER — PANTOPRAZOLE SODIUM 40 MG IV SOLR
40.0000 mg | Freq: Once | INTRAVENOUS | Status: AC
  Administered 2023-10-19: 40 mg via INTRAVENOUS
  Filled 2023-10-19: qty 10

## 2023-10-19 MED ORDER — BACITRACIN ZINC 500 UNIT/GM EX OINT
TOPICAL_OINTMENT | Freq: Two times a day (BID) | CUTANEOUS | Status: DC
  Administered 2023-10-19: 1 via TOPICAL
  Filled 2023-10-19: qty 1.8

## 2023-10-19 MED ORDER — ONDANSETRON HCL 4 MG/2ML IJ SOLN
4.0000 mg | Freq: Once | INTRAMUSCULAR | Status: AC
  Administered 2023-10-19: 4 mg via INTRAVENOUS
  Filled 2023-10-19: qty 2

## 2023-10-19 MED ORDER — SODIUM CHLORIDE 0.9 % IV BOLUS
1000.0000 mL | Freq: Once | INTRAVENOUS | Status: AC
  Administered 2023-10-19: 1000 mL via INTRAVENOUS

## 2023-10-19 NOTE — ED Notes (Signed)
 Pt left without discharge paperwork, instructions, or discharge vital signs.

## 2023-10-19 NOTE — ED Notes (Signed)
 Pt ambulated in hallway. Pt had steady gait and only complained of slight left leg pain

## 2023-10-19 NOTE — Discharge Instructions (Signed)
 Take the Protonix as directed for the next 2 weeks.  Return for vomiting of any blood.  Or if you have frequent episodes of coffee-ground emesis.
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
## 2023-10-19 NOTE — ED Notes (Signed)
 Pt on phone with mother arranging discharge transportation
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
## 2024-04-07 ENCOUNTER — Emergency Department (HOSPITAL_BASED_OUTPATIENT_CLINIC_OR_DEPARTMENT_OTHER)
Admission: EM | Admit: 2024-04-07 | Discharge: 2024-04-07 | Discharge status: Against Medical Advice | Attending: Emergency Medicine | Admitting: Emergency Medicine

## 2024-04-07 ENCOUNTER — Encounter (HOSPITAL_BASED_OUTPATIENT_CLINIC_OR_DEPARTMENT_OTHER): Payer: Self-pay | Admitting: Urology

## 2024-04-07 ENCOUNTER — Other Ambulatory Visit: Payer: Self-pay

## 2024-04-07 DIAGNOSIS — R109 Unspecified abdominal pain: Secondary | ICD-10-CM | POA: Insufficient documentation

## 2024-04-07 DIAGNOSIS — M25512 Pain in left shoulder: Secondary | ICD-10-CM | POA: Insufficient documentation

## 2024-04-07 DIAGNOSIS — Z5321 Procedure and treatment not carried out due to patient leaving prior to being seen by health care provider: Secondary | ICD-10-CM | POA: Diagnosis not present

## 2024-04-07 DIAGNOSIS — M545 Low back pain, unspecified: Secondary | ICD-10-CM | POA: Insufficient documentation

## 2024-04-07 DIAGNOSIS — Y92410 Unspecified street and highway as the place of occurrence of the external cause: Secondary | ICD-10-CM | POA: Insufficient documentation
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
